# Patient Record
Sex: Male | Born: 1990 | Race: White | Hispanic: No | Marital: Single | Smoking: Current every day smoker
Health system: Southern US, Community
[De-identification: ages and names within clinical notes are randomized; demographics above are authoritative.]

## PROBLEM LIST (undated history)

## (undated) DIAGNOSIS — S12110A Anterior displaced Type II dens fracture, initial encounter for closed fracture: Secondary | ICD-10-CM

## (undated) HISTORY — PX: MOUTH SURGERY: SHX715

---
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
## 2010-01-21 ENCOUNTER — Emergency Department (HOSPITAL_COMMUNITY)
Admission: EM | Admit: 2010-01-21 | Discharge: 2010-01-21 | Payer: Self-pay | Source: Home / Self Care | Admitting: Emergency Medicine

## 2010-06-02 LAB — URINALYSIS, ROUTINE W REFLEX MICROSCOPIC
Glucose, UA: NEGATIVE mg/dL
Ketones, ur: NEGATIVE mg/dL
pH: 7.5 (ref 5.0–8.0)

## 2010-06-02 LAB — COMPREHENSIVE METABOLIC PANEL
ALT: 10 U/L (ref 0–53)
AST: 18 U/L (ref 0–37)
Albumin: 4.3 g/dL (ref 3.5–5.2)
Alkaline Phosphatase: 55 U/L (ref 52–171)
Potassium: 3.5 mEq/L (ref 3.5–5.1)
Sodium: 136 mEq/L (ref 135–145)
Total Protein: 6.6 g/dL (ref 6.0–8.3)

## 2010-06-02 LAB — DIFFERENTIAL
Eosinophils Absolute: 0.1 10*3/uL (ref 0.0–1.2)
Eosinophils Relative: 1 % (ref 0–5)
Lymphs Abs: 2 10*3/uL (ref 1.1–4.8)
Monocytes Absolute: 0.3 10*3/uL (ref 0.2–1.2)
Monocytes Relative: 6 % (ref 3–11)
Neutrophils Relative %: 57 % (ref 43–71)

## 2010-06-02 LAB — CBC
MCHC: 35.4 g/dL (ref 31.0–37.0)
Platelets: 190 10*3/uL (ref 150–400)
RDW: 12.8 % (ref 11.4–15.5)
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
## 2010-11-30 LAB — RAPID URINE DRUG SCREEN, HOSP PERFORMED
Amphetamines: NOT DETECTED
Benzodiazepines: NOT DETECTED

## 2011-10-17 DIAGNOSIS — S12110A Anterior displaced Type II dens fracture, initial encounter for closed fracture: Secondary | ICD-10-CM

## 2011-10-17 HISTORY — DX: Anterior displaced type ii dens fracture, initial encounter for closed fracture: S12.110A

## 2011-11-10 ENCOUNTER — Emergency Department (HOSPITAL_COMMUNITY)
Admission: EM | Admit: 2011-11-10 | Discharge: 2011-11-11 | Disposition: A | Discharge status: Home / Self Care | Payer: BC Managed Care – PPO | Attending: Emergency Medicine | Admitting: Emergency Medicine

## 2011-11-10 ENCOUNTER — Emergency Department (HOSPITAL_COMMUNITY): Payer: BC Managed Care – PPO

## 2011-11-10 ENCOUNTER — Encounter (HOSPITAL_COMMUNITY): Payer: Self-pay

## 2011-11-10 DIAGNOSIS — S12100A Unspecified displaced fracture of second cervical vertebra, initial encounter for closed fracture: Secondary | ICD-10-CM

## 2011-11-10 DIAGNOSIS — S91009A Unspecified open wound, unspecified ankle, initial encounter: Secondary | ICD-10-CM | POA: Insufficient documentation

## 2011-11-10 DIAGNOSIS — Y92410 Unspecified street and highway as the place of occurrence of the external cause: Secondary | ICD-10-CM | POA: Insufficient documentation

## 2011-11-10 DIAGNOSIS — Z23 Encounter for immunization: Secondary | ICD-10-CM | POA: Insufficient documentation

## 2011-11-10 DIAGNOSIS — R079 Chest pain, unspecified: Secondary | ICD-10-CM | POA: Insufficient documentation

## 2011-11-10 DIAGNOSIS — S81009A Unspecified open wound, unspecified knee, initial encounter: Secondary | ICD-10-CM | POA: Insufficient documentation

## 2011-11-10 DIAGNOSIS — M542 Cervicalgia: Secondary | ICD-10-CM | POA: Insufficient documentation

## 2011-11-10 LAB — CBC
HCT: 45.9 % (ref 39.0–52.0)
MCHC: 35.9 g/dL (ref 30.0–36.0)
MCV: 90.2 fL (ref 78.0–100.0)
RDW: 12.5 % (ref 11.5–15.5)

## 2011-11-10 LAB — COMPREHENSIVE METABOLIC PANEL
Albumin: 4.3 g/dL (ref 3.5–5.2)
BUN: 6 mg/dL (ref 6–23)
Creatinine, Ser: 0.8 mg/dL (ref 0.50–1.35)
Potassium: 3.4 mEq/L — ABNORMAL LOW (ref 3.5–5.1)
Total Protein: 6.9 g/dL (ref 6.0–8.3)

## 2011-11-10 LAB — ETHANOL: Alcohol, Ethyl (B): 204 mg/dL — ABNORMAL HIGH (ref 0–11)

## 2011-11-10 MED ORDER — SODIUM CHLORIDE 0.9 % IV BOLUS (SEPSIS)
1000.0000 mL | Freq: Once | INTRAVENOUS | Status: AC
  Administered 2011-11-10: 1000 mL via INTRAVENOUS

## 2011-11-10 MED ORDER — TETANUS-DIPHTHERIA TOXOIDS TD 5-2 LFU IM INJ
0.5000 mL | INJECTION | Freq: Once | INTRAMUSCULAR | Status: AC
  Administered 2011-11-10: 0.5 mL via INTRAMUSCULAR
  Filled 2011-11-10: qty 0.5
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
## 2011-11-11 ENCOUNTER — Emergency Department (HOSPITAL_COMMUNITY): Payer: BC Managed Care – PPO

## 2011-11-11 ENCOUNTER — Encounter (HOSPITAL_COMMUNITY): Payer: Self-pay | Admitting: Radiology

## 2011-11-11 MED ORDER — IOHEXOL 300 MG/ML  SOLN
100.0000 mL | Freq: Once | INTRAMUSCULAR | Status: AC | PRN
  Administered 2011-11-11: 100 mL via INTRAVENOUS

## 2011-11-11 MED ORDER — HYDROCODONE-ACETAMINOPHEN 5-500 MG PO TABS: 1.0000 | ORAL_TABLET | Freq: Four times a day (QID) | ORAL | Status: DC | PRN

## 2011-11-11 NOTE — ED Notes (Signed)
Pt. To CT

## 2011-11-11 NOTE — ED Notes (Signed)
Pt. Moving all extremities. Pedal and radial pulses strong bilaterally. Pt. Able to feel touch in all four extremities, states "It feels a little lighter in lower extremities". Pt. Very anxious.

## 2011-11-11 NOTE — ED Notes (Signed)
Pt. Told of c-spine precautions. Pt. Verbalized understanding of risks and benefits of using ccollar. Tried to help patient find a ride home, called mother multiple times with no answer. Pt began cursing at this nurse. [employer] to escort patient out. Pt. Refused wheelchair, bus pass, or cab options. Pt. Walked with stable gait to lobby with [employer] officer.

## 2011-11-16 HISTORY — PX: ODONTOID SCREW INSERTION: SHX5704

## 2011-11-26 ENCOUNTER — Other Ambulatory Visit: Payer: Self-pay | Admitting: Neurosurgery

## 2011-11-26 ENCOUNTER — Encounter (HOSPITAL_COMMUNITY): Payer: Self-pay | Admitting: Pharmacy Technician

## 2011-12-03 ENCOUNTER — Encounter (HOSPITAL_COMMUNITY)
Admission: RE | Admit: 2011-12-03 | Discharge: 2011-12-03 | Disposition: A | Discharge status: Home / Self Care | Payer: BC Managed Care – PPO | Source: Ambulatory Visit | Attending: Neurosurgery | Admitting: Neurosurgery

## 2011-12-03 ENCOUNTER — Encounter (HOSPITAL_COMMUNITY): Payer: Self-pay

## 2011-12-03 LAB — CBC
HCT: 46.1 % (ref 39.0–52.0)
Hemoglobin: 16 g/dL (ref 13.0–17.0)
MCHC: 34.7 g/dL (ref 30.0–36.0)
MCV: 91.5 fL (ref 78.0–100.0)

## 2011-12-03 LAB — SURGICAL PCR SCREEN: Staphylococcus aureus: NEGATIVE

## 2011-12-03 NOTE — Pre-Procedure Instructions (Signed)
Brady Adams  12/03/2011   Your procedure is scheduled on:  12-09-2011  Report to [facility] at 9:30 AM.  Call this number if you have problems the morning of surgery: [phone]   Remember:   Do not eat food:After Midnight.      Take these medicines the morning of surgery with A SIP OF WATER: pain medication as needed   Do not wear jewelry  Do not wear lotions, powders, or perfumes. You may wear deodorant.  Do not shave 48 hours prior to surgery. Men may shave face and neck.  Do not bring valuables to the hospital.  Contacts, dentures or bridgework may not be worn into surgery.  Leave suitcase in the car. After surgery it may be brought to your room.    For patients admitted to the hospital, checkout time is 11:00 AM the day of discharge.   Patients discharged the day of surgery will not be allowed to drive home.   Name and phone number of your driver: ______________    Special Instructions: Shower using CHG 2 nights before surgery and the night before surgery.  If you shower the day of surgery use CHG.  Use special wash - you have one bottle of CHG for all showers.  You should use approximately 1/3 of the bottle for each shower.     Please read over the following fact sheets that you were given: Pain Booklet and Surgical Site Infection Prevention

## 2011-12-08 MED ORDER — CEFAZOLIN SODIUM-DEXTROSE 2-3 GM-% IV SOLR
2.0000 g | INTRAVENOUS | Status: AC
  Administered 2011-12-09: 2 g via INTRAVENOUS

## 2011-12-09 ENCOUNTER — Encounter (HOSPITAL_COMMUNITY): Payer: Self-pay | Admitting: Certified Registered Nurse Anesthetist

## 2011-12-09 ENCOUNTER — Ambulatory Visit (HOSPITAL_COMMUNITY): Payer: BC Managed Care – PPO

## 2011-12-09 ENCOUNTER — Encounter (HOSPITAL_COMMUNITY)
Admission: RE | Disposition: A | Discharge status: Home / Self Care | Payer: Self-pay | Source: Ambulatory Visit | Attending: Neurosurgery

## 2011-12-09 ENCOUNTER — Encounter (HOSPITAL_COMMUNITY): Payer: Self-pay | Admitting: Certified Registered"

## 2011-12-09 ENCOUNTER — Ambulatory Visit (HOSPITAL_COMMUNITY)
Admission: RE | Admit: 2011-12-09 | Discharge: 2011-12-10 | Disposition: A | Discharge status: Home / Self Care | Payer: BC Managed Care – PPO | Source: Ambulatory Visit | Attending: Neurosurgery | Admitting: Neurosurgery

## 2011-12-09 ENCOUNTER — Ambulatory Visit (HOSPITAL_COMMUNITY): Payer: BC Managed Care – PPO | Admitting: Certified Registered"

## 2011-12-09 DIAGNOSIS — Z01812 Encounter for preprocedural laboratory examination: Secondary | ICD-10-CM | POA: Insufficient documentation

## 2011-12-09 DIAGNOSIS — S12100A Unspecified displaced fracture of second cervical vertebra, initial encounter for closed fracture: Secondary | ICD-10-CM | POA: Insufficient documentation

## 2011-12-09 SURGERY — INSERTION, SCREW, ODONTOID
Anesthesia: General | Site: Neck | Wound class: Clean

## 2011-12-09 MED ORDER — NEOSTIGMINE METHYLSULFATE 1 MG/ML IJ SOLN
INTRAMUSCULAR | Status: DC | PRN
  Administered 2011-12-09: 3 mg via INTRAVENOUS

## 2011-12-09 MED ORDER — BACITRACIN 50000 UNITS IM SOLR
INTRAMUSCULAR | Status: AC
  Filled 2011-12-09: qty 1

## 2011-12-09 MED ORDER — HYDROMORPHONE HCL PF 1 MG/ML IJ SOLN
INTRAMUSCULAR | Status: AC
  Filled 2011-12-09: qty 1

## 2011-12-09 MED ORDER — ONDANSETRON HCL 4 MG/2ML IJ SOLN: 4.0000 mg | Freq: Once | INTRAMUSCULAR | Status: DC | PRN

## 2011-12-09 MED ORDER — PROPOFOL 10 MG/ML IV BOLUS
INTRAVENOUS | Status: DC | PRN
  Administered 2011-12-09: 150 mg via INTRAVENOUS

## 2011-12-09 MED ORDER — LACTATED RINGERS IV SOLN: INTRAVENOUS | Status: DC

## 2011-12-09 MED ORDER — ONDANSETRON HCL 4 MG/2ML IJ SOLN
INTRAMUSCULAR | Status: DC | PRN
  Administered 2011-12-09: 4 mg via INTRAVENOUS

## 2011-12-09 MED ORDER — ROCURONIUM BROMIDE 100 MG/10ML IV SOLN
INTRAVENOUS | Status: DC | PRN
  Administered 2011-12-09: 20 mg via INTRAVENOUS

## 2011-12-09 MED ORDER — BACITRACIN ZINC 500 UNIT/GM EX OINT
TOPICAL_OINTMENT | CUTANEOUS | Status: DC | PRN
  Administered 2011-12-09: 1 via TOPICAL

## 2011-12-09 MED ORDER — ARTIFICIAL TEARS OP OINT
TOPICAL_OINTMENT | OPHTHALMIC | Status: DC | PRN
  Administered 2011-12-09: 1 via OPHTHALMIC

## 2011-12-09 MED ORDER — SUCCINYLCHOLINE CHLORIDE 20 MG/ML IJ SOLN
INTRAMUSCULAR | Status: DC | PRN
  Administered 2011-12-09: 100 mg via INTRAVENOUS

## 2011-12-09 MED ORDER — THROMBIN 5000 UNITS EX KIT
PACK | CUTANEOUS | Status: DC | PRN
  Administered 2011-12-09 (×2): 5000 [IU] via TOPICAL

## 2011-12-09 MED ORDER — LACTATED RINGERS IV SOLN
INTRAVENOUS | Status: DC | PRN
  Administered 2011-12-09 (×2): via INTRAVENOUS

## 2011-12-09 MED ORDER — ZOLPIDEM TARTRATE 5 MG PO TABS: 5.0000 mg | ORAL_TABLET | Freq: Every evening | ORAL | Status: DC | PRN

## 2011-12-09 MED ORDER — OXYCODONE-ACETAMINOPHEN 5-325 MG PO TABS
1.0000 | ORAL_TABLET | ORAL | Status: DC | PRN
  Administered 2011-12-10: 2 via ORAL
  Filled 2011-12-09: qty 2

## 2011-12-09 MED ORDER — PHENYLEPHRINE HCL 10 MG/ML IJ SOLN
INTRAMUSCULAR | Status: DC | PRN
  Administered 2011-12-09 (×2): 20 ug via INTRAVENOUS

## 2011-12-09 MED ORDER — ONDANSETRON HCL 4 MG/2ML IJ SOLN: 4.0000 mg | INTRAMUSCULAR | Status: DC | PRN

## 2011-12-09 MED ORDER — HEMOSTATIC AGENTS (NO CHARGE) OPTIME
TOPICAL | Status: DC | PRN
  Administered 2011-12-09: 1 via TOPICAL

## 2011-12-09 MED ORDER — MIDAZOLAM HCL 5 MG/5ML IJ SOLN
INTRAMUSCULAR | Status: DC | PRN
  Administered 2011-12-09: 2 mg via INTRAVENOUS

## 2011-12-09 MED ORDER — PHENOL 1.4 % MT LIQD: 1.0000 | OROMUCOSAL | Status: DC | PRN

## 2011-12-09 MED ORDER — DOCUSATE SODIUM 100 MG PO CAPS
100.0000 mg | ORAL_CAPSULE | Freq: Two times a day (BID) | ORAL | Status: DC
  Administered 2011-12-09 – 2011-12-10 (×2): 100 mg via ORAL
  Filled 2011-12-09 (×2): qty 1

## 2011-12-09 MED ORDER — GLYCOPYRROLATE 0.2 MG/ML IJ SOLN
INTRAMUSCULAR | Status: DC | PRN
  Administered 2011-12-09: .4 mg via INTRAVENOUS

## 2011-12-09 MED ORDER — FENTANYL CITRATE 0.05 MG/ML IJ SOLN
INTRAMUSCULAR | Status: DC | PRN
  Administered 2011-12-09: 100 ug via INTRAVENOUS
  Administered 2011-12-09 (×2): 50 ug via INTRAVENOUS

## 2011-12-09 MED ORDER — HYDROMORPHONE HCL PF 1 MG/ML IJ SOLN
0.2500 mg | INTRAMUSCULAR | Status: DC | PRN
  Administered 2011-12-09 (×4): 0.5 mg via INTRAVENOUS

## 2011-12-09 MED ORDER — DIAZEPAM 5 MG PO TABS
ORAL_TABLET | ORAL | Status: AC
  Filled 2011-12-09: qty 1

## 2011-12-09 MED ORDER — MORPHINE SULFATE 2 MG/ML IJ SOLN: 1.0000 mg | INTRAMUSCULAR | Status: DC | PRN

## 2011-12-09 MED ORDER — DEXAMETHASONE SODIUM PHOSPHATE 4 MG/ML IJ SOLN
4.0000 mg | Freq: Four times a day (QID) | INTRAMUSCULAR | Status: AC
  Administered 2011-12-09 – 2011-12-10 (×3): 4 mg via INTRAVENOUS
  Filled 2011-12-09 (×3): qty 1

## 2011-12-09 MED ORDER — CEFAZOLIN SODIUM-DEXTROSE 2-3 GM-% IV SOLR
2.0000 g | Freq: Three times a day (TID) | INTRAVENOUS | Status: AC
  Administered 2011-12-10 (×2): 2 g via INTRAVENOUS
  Filled 2011-12-09 (×2): qty 50

## 2011-12-09 MED ORDER — OXYCODONE-ACETAMINOPHEN 5-325 MG PO TABS
ORAL_TABLET | ORAL | Status: AC
  Filled 2011-12-09: qty 2

## 2011-12-09 MED ORDER — 0.9 % SODIUM CHLORIDE (POUR BTL) OPTIME
TOPICAL | Status: DC | PRN
  Administered 2011-12-09: 1000 mL

## 2011-12-09 MED ORDER — HYDROCODONE-ACETAMINOPHEN 5-325 MG PO TABS: 1.0000 | ORAL_TABLET | ORAL | Status: DC | PRN

## 2011-12-09 MED ORDER — SODIUM CHLORIDE 0.9 % IV SOLN
INTRAVENOUS | Status: AC
  Filled 2011-12-09: qty 500

## 2011-12-09 MED ORDER — ACETAMINOPHEN 325 MG PO TABS: 650.0000 mg | ORAL_TABLET | ORAL | Status: DC | PRN

## 2011-12-09 MED ORDER — ACETAMINOPHEN 650 MG RE SUPP: 650.0000 mg | RECTAL | Status: DC | PRN

## 2011-12-09 MED ORDER — MENTHOL 3 MG MT LOZG: 1.0000 | LOZENGE | OROMUCOSAL | Status: DC | PRN

## 2011-12-09 MED ORDER — BUPIVACAINE-EPINEPHRINE 0.5% -1:200000 IJ SOLN
INTRAMUSCULAR | Status: DC | PRN
  Administered 2011-12-09: 10 mL

## 2011-12-09 MED ORDER — LIDOCAINE HCL 4 % MT SOLN
OROMUCOSAL | Status: DC | PRN
  Administered 2011-12-09: 4 mL via TOPICAL

## 2011-12-09 MED ORDER — DEXAMETHASONE 4 MG PO TABS: 4.0000 mg | ORAL_TABLET | Freq: Four times a day (QID) | ORAL | Status: AC

## 2011-12-09 MED ORDER — LIDOCAINE HCL (CARDIAC) 20 MG/ML IV SOLN
INTRAVENOUS | Status: DC | PRN
  Administered 2011-12-09: 60 mg via INTRAVENOUS

## 2011-12-09 MED ORDER — SODIUM CHLORIDE 0.9 % IR SOLN
Status: DC | PRN
  Administered 2011-12-09: 16:00:00

## 2011-12-09 MED ORDER — DIAZEPAM 5 MG PO TABS
5.0000 mg | ORAL_TABLET | Freq: Four times a day (QID) | ORAL | Status: DC | PRN
  Administered 2011-12-09 – 2011-12-10 (×2): 5 mg via ORAL
  Filled 2011-12-09: qty 1

## 2011-12-09 SURGICAL SUPPLY — 53 items
APL SKNCLS STERI-STRIP NONHPOA (GAUZE/BANDAGES/DRESSINGS) ×1
BENZOIN TINCTURE PRP APPL 2/3 (GAUZE/BANDAGES/DRESSINGS) ×2 IMPLANT
BIT DRILL UCSC CANN STRL (BIT) IMPLANT
BLADE SURG ROTATE 9660 (MISCELLANEOUS) IMPLANT
CANISTER SUCTION 2500CC (MISCELLANEOUS) ×2 IMPLANT
CLOTH BEACON ORANGE TIMEOUT ST (SAFETY) ×2 IMPLANT
CONT SPEC 4OZ CLIKSEAL STRL BL (MISCELLANEOUS) ×2 IMPLANT
DRAPE C-ARM 42X72 X-RAY (DRAPES) ×3 IMPLANT
DRAPE LAPAROTOMY 100X72 PEDS (DRAPES) ×2 IMPLANT
DRAPE MICROSCOPE LEICA (MISCELLANEOUS) IMPLANT
DRAPE POUCH INSTRU U-SHP 10X18 (DRAPES) ×2 IMPLANT
DRAPE PROXIMA HALF (DRAPES) IMPLANT
DRILL BIT UCSC CANN STERILE (BIT) ×2
ELECT BLADE 4.0 EZ CLEAN MEGAD (MISCELLANEOUS) ×2
ELECT REM PT RETURN 9FT ADLT (ELECTROSURGICAL) ×2
ELECTRODE BLDE 4.0 EZ CLN MEGD (MISCELLANEOUS) IMPLANT
ELECTRODE REM PT RTRN 9FT ADLT (ELECTROSURGICAL) ×1 IMPLANT
GAUZE SPONGE 4X4 16PLY XRAY LF (GAUZE/BANDAGES/DRESSINGS) IMPLANT
GLOVE BIO SURGEON STRL SZ7 (GLOVE) ×1 IMPLANT
GLOVE BIO SURGEON STRL SZ8.5 (GLOVE) ×2 IMPLANT
GLOVE BIOGEL PI IND STRL 7.5 (GLOVE) IMPLANT
GLOVE BIOGEL PI IND STRL 8 (GLOVE) IMPLANT
GLOVE BIOGEL PI INDICATOR 7.5 (GLOVE) ×2
GLOVE BIOGEL PI INDICATOR 8 (GLOVE) ×1
GLOVE ECLIPSE 7.5 STRL STRAW (GLOVE) ×2 IMPLANT
GLOVE EXAM NITRILE LRG STRL (GLOVE) IMPLANT
GLOVE EXAM NITRILE MD LF STRL (GLOVE) ×1 IMPLANT
GLOVE EXAM NITRILE XL STR (GLOVE) IMPLANT
GLOVE EXAM NITRILE XS STR PU (GLOVE) IMPLANT
GLOVE INDICATOR 7.0 STRL GRN (GLOVE) ×1 IMPLANT
GLOVE SS BIOGEL STRL SZ 8 (GLOVE) ×1 IMPLANT
GLOVE SUPERSENSE BIOGEL SZ 8 (GLOVE) ×1
GOWN BRE IMP SLV AUR LG STRL (GOWN DISPOSABLE) ×1 IMPLANT
GOWN BRE IMP SLV AUR XL STRL (GOWN DISPOSABLE) ×4 IMPLANT
GUIDE WIRE ×1 IMPLANT
KIT BASIN OR (CUSTOM PROCEDURE TRAY) ×2 IMPLANT
KIT ROOM TURNOVER OR (KITS) ×2 IMPLANT
NDL HYPO 25X1 1.5 SAFETY (NEEDLE) ×1 IMPLANT
NEEDLE HYPO 25X1 1.5 SAFETY (NEEDLE) ×2 IMPLANT
NS IRRIG 1000ML POUR BTL (IV SOLUTION) ×2 IMPLANT
PACK LAMINECTOMY NEURO (CUSTOM PROCEDURE TRAY) ×2 IMPLANT
RUBBERBAND STERILE (MISCELLANEOUS) IMPLANT
SCREW LAG 40MM (Screw) ×1 IMPLANT
SPONGE GAUZE 4X4 12PLY (GAUZE/BANDAGES/DRESSINGS) ×2 IMPLANT
SPONGE INTESTINAL PEANUT (DISPOSABLE) ×2 IMPLANT
SPONGE SURGIFOAM ABS GEL SZ50 (HEMOSTASIS) ×2 IMPLANT
STRIP CLOSURE SKIN 1/2X4 (GAUZE/BANDAGES/DRESSINGS) ×2 IMPLANT
SUT VIC AB 3-0 SH 8-18 (SUTURE) ×3 IMPLANT
SYR 20ML ECCENTRIC (SYRINGE) ×2 IMPLANT
TAPE CLOTH SURG 4X10 WHT LF (GAUZE/BANDAGES/DRESSINGS) ×1 IMPLANT
TOWEL OR 17X24 6PK STRL BLUE (TOWEL DISPOSABLE) ×2 IMPLANT
TOWEL OR 17X26 10 PK STRL BLUE (TOWEL DISPOSABLE) ×2 IMPLANT
WATER STERILE IRR 1000ML POUR (IV SOLUTION) ×2 IMPLANT

## 2011-12-09 NOTE — Preoperative (Signed)
Beta Blockers   Reason not to administer Beta Blockers:Not Applicable 

## 2011-12-09 NOTE — Anesthesia Procedure Notes (Signed)
Procedure Name: Intubation Date/Time: 12/09/2011 3:21 PM Performed by: Margaree Mackintosh Pre-anesthesia Checklist: Patient identified, Timeout performed, Emergency Drugs available, Suction available and Patient being monitored Patient Re-evaluated:Patient Re-evaluated prior to inductionOxygen Delivery Method: Circle system utilized Preoxygenation: Pre-oxygenation with 100% oxygen Intubation Type: IV induction and Rapid sequence Laryngoscope size: Glidescope utilized. Tube size: 7.5 mm Number of attempts: 1 Airway Equipment and Method: Video-laryngoscopy,  Rigid stylet and LTA kit utilized Placement Confirmation: ETT inserted through vocal cords under direct vision,  positive ETCO2 and breath sounds checked- equal and bilateral Secured at: 22 cm Tube secured with: Tape Dental Injury: Teeth and Oropharynx as per pre-operative assessment  Comments: Head and neck held neutral by Dr.Smith during intubation
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
## 2011-12-09 NOTE — Transfer of Care (Signed)
Immediate Anesthesia Transfer of Care Note  Patient: Brady Adams  Procedure(s) Performed: Procedure(s) (LRB) with comments: ODONTOID SCREW INSERTION (N/A) - Odontoid screw fixation  Patient Location: PACU  Anesthesia Type: General  Level of Consciousness: awake, alert  and oriented  Airway & Oxygen Therapy: Patient Spontanous Breathing and Patient connected to nasal cannula oxygen  Post-op Assessment: Report given to PACU RN, Post -op Vital signs reviewed and stable and Patient moving all extremities X 4  Post vital signs: Reviewed and stable  Complications: No apparent anesthesia complications

## 2011-12-09 NOTE — Anesthesia Postprocedure Evaluation (Signed)
  Anesthesia Post-op Note  Patient: Brady Adams  Procedure(s) Performed: Procedure(s) (LRB) with comments: ODONTOID SCREW INSERTION (N/A) - Odontoid screw fixation  Patient Location: PACU  Anesthesia Type: General  Level of Consciousness: awake  Airway and Oxygen Therapy: Patient Spontanous Breathing  Post-op Pain: mild  Post-op Assessment: Post-op Vital signs reviewed  Post-op Vital Signs: Reviewed  Complications: No apparent anesthesia complications

## 2011-12-09 NOTE — Anesthesia Preprocedure Evaluation (Signed)
Anesthesia Evaluation  Patient identified by MRN, date of birth, ID band Patient awake    Reviewed: Allergy & Precautions, H&P , NPO status , Patient's Chart, lab work & pertinent test results  Airway       Dental   Pulmonary          Cardiovascular     Neuro/Psych    GI/Hepatic   Endo/Other    Renal/GU      Musculoskeletal   Abdominal   Peds  Hematology   Anesthesia Other Findings   Reproductive/Obstetrics                           Anesthesia Physical Anesthesia Plan  ASA: I  Anesthesia Plan: General   Post-op Pain Management:    Induction: Intravenous  Airway Management Planned: LMA and Oral ETT  Additional Equipment:   Intra-op Plan:   Post-operative Plan: Extubation in OR  Informed Consent: I have reviewed the patients History and Physical, chart, labs and discussed the procedure including the risks, benefits and alternatives for the proposed anesthesia with the patient or authorized representative who has indicated his/her understanding and acceptance.     Plan Discussed with: CRNA, Anesthesiologist and Surgeon  Anesthesia Plan Comments:         Anesthesia Quick Evaluation  
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
## 2011-12-09 NOTE — Progress Notes (Signed)
Subjective:  The patient is somnolent but easily arousable. He looks well.  Objective: Vital signs in last 24 hours: Temp:  [97.4 F (36.3 C)-97.7 F (36.5 C)] 97.4 F (36.3 C) (10/24 1728) Pulse Rate:  [64-86] 64  (10/24 1728) Resp:  [10-20] 10  (10/24 1728) BP: (114-141)/(72-84) 141/84 mmHg (10/24 1728) SpO2:  [99 %-100 %] 100 % (10/24 1728)  Intake/Output from previous day:   Intake/Output this shift: Total I/O In: 1400 [I.V.:1400] Out: 15 [Blood:15]  Physical exam the patient is somnolent but easily arousable. He is moving all 4 extremities well. His dressing is clean and dry. There is no evidence of hematoma or shift.  Lab Results: No results found for this basename: WBC:2,HGB:2,HCT:2,PLT:2 in the last 72 hours BMET No results found for this basename: NA:2,K:2,CL:2,CO2:2,GLUCOSE:2,BUN:2,CREATININE:2,CALCIUM:2 in the last 72 hours  Studies/Results: No results found.  Assessment/Plan: The patient is doing well.  LOS: 0 days     Arleene Settle D 12/09/2011, 5:38 PM

## 2011-12-10 MED ORDER — DSS 100 MG PO CAPS: 100.0000 mg | ORAL_CAPSULE | Freq: Two times a day (BID) | ORAL | Status: DC

## 2011-12-10 MED ORDER — OXYCODONE-ACETAMINOPHEN 10-325 MG PO TABS: 1.0000 | ORAL_TABLET | ORAL | Status: DC | PRN

## 2011-12-10 MED ORDER — DIAZEPAM 5 MG PO TABS: 5.0000 mg | ORAL_TABLET | Freq: Four times a day (QID) | ORAL | Status: DC | PRN

## 2011-12-10 NOTE — Plan of Care (Signed)
Problem: Consults Goal: Diagnosis - Spinal Surgery Outcome: Completed/Met Date Met:  12/10/11 Cervical Spine Fusion     

## 2011-12-10 NOTE — Discharge Summary (Signed)
  Physician Discharge Summary  Patient ID: Brady Adams MRN: [id number] DOB/AGE: 21-27-1992 21 y.o.  Admit date: 12/09/2011 Discharge date: 12/10/2011  Admission Diagnoses: Type II odontoid fracture, cervicalgia  Discharge Diagnoses: The same Active Problems:  * No active hospital problems. *    Discharged Condition: good  Hospital Course: I admitted the patient to [facility] on 10/24 13. On that day I performed at anterior odontoid screw fixation of a type II odontoid fracture. The surgery went well. The patient's postoperative course was unremarkable. On postop day #1 the patient was requesting discharge to home. He was given oral and written discharge instructions. All his questions were answered.  Consults: None Significant Diagnostic Studies: None Treatments: Anterior odontoid screw fixation of type II odontoid fracture Discharge Exam: Blood pressure 119/62, pulse 99, temperature 98.6 F (37 C), temperature source Oral, resp. rate 18, SpO2 97.00%. The patient is alert and oriented. He looks well. His strength is normal in all 4 extremities. His dressing is clean and dry. There is no evidence of hematoma or shift.  Disposition: Home     Medication List     As of 12/10/2011  9:34 AM    STOP taking these medications         HYDROcodone-acetaminophen 5-500 MG per tablet   Commonly known as: VICODIN      naproxen sodium 220 MG tablet   Commonly known as: ANAPROX      TAKE these medications         diazepam 5 MG tablet   Commonly known as: VALIUM   Take 1 tablet (5 mg total) by mouth every 6 (six) hours as needed.      DSS 100 MG Caps   Take 100 mg by mouth 2 (two) times daily.      oxyCODONE-acetaminophen 10-325 MG per tablet   Commonly known as: PERCOCET   Take 1 tablet by mouth every 4 (four) hours as needed for pain.         SignedCristi Loron 12/10/2011, 9:34 AM

## 2012-03-02 ENCOUNTER — Emergency Department (HOSPITAL_COMMUNITY): Payer: BC Managed Care – PPO

## 2012-03-02 ENCOUNTER — Inpatient Hospital Stay (HOSPITAL_COMMUNITY): Payer: BC Managed Care – PPO

## 2012-03-02 ENCOUNTER — Encounter (HOSPITAL_COMMUNITY): Payer: Self-pay | Admitting: *Deleted

## 2012-03-02 ENCOUNTER — Inpatient Hospital Stay (HOSPITAL_COMMUNITY)
Admission: EM | Admit: 2012-03-02 | Discharge: 2012-03-09 | DRG: 793 | Disposition: A | Discharge status: Home / Self Care | Payer: BC Managed Care – PPO | Attending: General Surgery | Admitting: General Surgery

## 2012-03-02 DIAGNOSIS — S12300A Unspecified displaced fracture of fourth cervical vertebra, initial encounter for closed fracture: Secondary | ICD-10-CM | POA: Diagnosis present

## 2012-03-02 DIAGNOSIS — IMO0002 Reserved for concepts with insufficient information to code with codable children: Principal | ICD-10-CM | POA: Diagnosis present

## 2012-03-02 DIAGNOSIS — S06339A Contusion and laceration of cerebrum, unspecified, with loss of consciousness of unspecified duration, initial encounter: Secondary | ICD-10-CM | POA: Diagnosis present

## 2012-03-02 DIAGNOSIS — F17200 Nicotine dependence, unspecified, uncomplicated: Secondary | ICD-10-CM | POA: Diagnosis present

## 2012-03-02 DIAGNOSIS — S0990XA Unspecified injury of head, initial encounter: Secondary | ICD-10-CM

## 2012-03-02 DIAGNOSIS — S069XAA Unspecified intracranial injury with loss of consciousness status unknown, initial encounter: Secondary | ICD-10-CM | POA: Diagnosis present

## 2012-03-02 DIAGNOSIS — S129XXA Fracture of neck, unspecified, initial encounter: Secondary | ICD-10-CM

## 2012-03-02 DIAGNOSIS — S069X9A Unspecified intracranial injury with loss of consciousness of unspecified duration, initial encounter: Secondary | ICD-10-CM

## 2012-03-02 DIAGNOSIS — J9600 Acute respiratory failure, unspecified whether with hypoxia or hypercapnia: Secondary | ICD-10-CM | POA: Diagnosis present

## 2012-03-02 DIAGNOSIS — S0101XA Laceration without foreign body of scalp, initial encounter: Secondary | ICD-10-CM | POA: Diagnosis present

## 2012-03-02 DIAGNOSIS — S0100XA Unspecified open wound of scalp, initial encounter: Secondary | ICD-10-CM | POA: Diagnosis present

## 2012-03-02 DIAGNOSIS — F1010 Alcohol abuse, uncomplicated: Secondary | ICD-10-CM | POA: Diagnosis present

## 2012-03-02 HISTORY — DX: Anterior displaced type ii dens fracture, initial encounter for closed fracture: S12.110A

## 2012-03-02 LAB — POCT I-STAT, CHEM 8
Calcium, Ion: 1.09 mmol/L — ABNORMAL LOW (ref 1.12–1.23)
Chloride: 109 mEq/L (ref 96–112)
Glucose, Bld: 101 mg/dL — ABNORMAL HIGH (ref 70–99)
HCT: 45 % (ref 39.0–52.0)
Hemoglobin: 15.3 g/dL (ref 13.0–17.0)
Potassium: 5.1 mEq/L (ref 3.5–5.1)

## 2012-03-02 LAB — COMPREHENSIVE METABOLIC PANEL
ALT: 24 U/L (ref 0–53)
AST: 41 U/L — ABNORMAL HIGH (ref 0–37)
Albumin: 4.3 g/dL (ref 3.5–5.2)
Alkaline Phosphatase: 57 U/L (ref 39–117)
CO2: 23 mEq/L (ref 19–32)
Chloride: 108 mEq/L (ref 96–112)
GFR calc non Af Amer: >90 mL/min (ref >90)
Potassium: 5.2 mEq/L — ABNORMAL HIGH (ref 3.5–5.1)
Sodium: 144 mEq/L (ref 135–145)
Total Bilirubin: 0.2 mg/dL — ABNORMAL LOW (ref 0.3–1.2)

## 2012-03-02 LAB — URINALYSIS, MICROSCOPIC ONLY
Bilirubin Urine: NEGATIVE
Ketones, ur: NEGATIVE mg/dL
Leukocytes, UA: NEGATIVE
Nitrite: NEGATIVE
Protein, ur: NEGATIVE mg/dL

## 2012-03-02 LAB — POCT I-STAT 3, ART BLOOD GAS (G3+)
Bicarbonate: 20.7 mEq/L (ref 20.0–24.0)
TCO2: 24 mmol/L (ref 0–100)
pCO2 arterial: 44.2 mmHg (ref 35.0–45.0)
pCO2 arterial: 35.2 mmHg (ref 35.0–45.0)
pH, Arterial: 7.325 — ABNORMAL LOW (ref 7.350–7.450)
pO2, Arterial: 531 mmHg — ABNORMAL HIGH (ref 80.0–100.0)
pO2, Arterial: 172 mmHg — ABNORMAL HIGH (ref 80.0–100.0)

## 2012-03-02 LAB — TYPE AND SCREEN: Unit division: 0

## 2012-03-02 LAB — MRSA PCR SCREENING: MRSA by PCR: NEGATIVE

## 2012-03-02 LAB — CBC
MCH: 31.4 pg (ref 26.0–34.0)
MCHC: 34.9 g/dL (ref 30.0–36.0)
MCV: 90.1 fL (ref 78.0–100.0)
Platelets: 180 10*3/uL (ref 150–400)

## 2012-03-02 LAB — PROTIME-INR: INR: 1.07 (ref 0.00–1.49)

## 2012-03-02 LAB — ABO/RH: ABO/RH(D): O POS

## 2012-03-02 LAB — ETHANOL: Alcohol, Ethyl (B): 237 mg/dL — ABNORMAL HIGH (ref 0–11)

## 2012-03-02 LAB — GLUCOSE, CAPILLARY: Glucose-Capillary: 98 mg/dL (ref 70–99)

## 2012-03-02 LAB — CG4 I-STAT (LACTIC ACID): Lactic Acid, Venous: 2.3 mmol/L — ABNORMAL HIGH (ref 0.5–2.2)

## 2012-03-02 MED ORDER — VECURONIUM BROMIDE 10 MG IV SOLR
INTRAVENOUS | Status: AC | PRN
  Administered 2012-03-02: 10 mg via INTRAVENOUS

## 2012-03-02 MED ORDER — MIDAZOLAM HCL 2 MG/2ML IJ SOLN
INTRAMUSCULAR | Status: AC
  Filled 2012-03-02: qty 2

## 2012-03-02 MED ORDER — FENTANYL BOLUS VIA INFUSION
25.0000 ug | Freq: Four times a day (QID) | INTRAVENOUS | Status: DC | PRN
  Filled 2012-03-02: qty 100

## 2012-03-02 MED ORDER — IOHEXOL 300 MG/ML  SOLN
80.0000 mL | Freq: Once | INTRAMUSCULAR | Status: AC | PRN
  Administered 2012-03-02: 80 mL via INTRAVENOUS

## 2012-03-02 MED ORDER — ROCURONIUM BROMIDE 50 MG/5ML IV SOLN
INTRAVENOUS | Status: AC
  Filled 2012-03-02: qty 2

## 2012-03-02 MED ORDER — BIOTENE DRY MOUTH MT LIQD
15.0000 mL | Freq: Four times a day (QID) | OROMUCOSAL | Status: DC
  Administered 2012-03-02 – 2012-03-03 (×4): 15 mL via OROMUCOSAL

## 2012-03-02 MED ORDER — FENTANYL CITRATE 0.05 MG/ML IJ SOLN
INTRAMUSCULAR | Status: AC
  Filled 2012-03-02: qty 2

## 2012-03-02 MED ORDER — MIDAZOLAM HCL 2 MG/2ML IJ SOLN
2.0000 mg | Freq: Once | INTRAMUSCULAR | Status: AC
  Administered 2012-03-02: 2 mg via INTRAVENOUS

## 2012-03-02 MED ORDER — ONDANSETRON HCL 4 MG PO TABS: 4.0000 mg | ORAL_TABLET | Freq: Four times a day (QID) | ORAL | Status: DC | PRN

## 2012-03-02 MED ORDER — PANTOPRAZOLE SODIUM 40 MG IV SOLR
40.0000 mg | Freq: Every day | INTRAVENOUS | Status: DC
  Administered 2012-03-03 – 2012-03-04 (×2): 40 mg via INTRAVENOUS
  Filled 2012-03-02 (×5): qty 40

## 2012-03-02 MED ORDER — FENTANYL CITRATE 0.05 MG/ML IJ SOLN
100.0000 ug | Freq: Once | INTRAMUSCULAR | Status: AC
  Administered 2012-03-02: 100 ug via INTRAVENOUS

## 2012-03-02 MED ORDER — NALOXONE HCL 0.4 MG/ML IJ SOLN
INTRAMUSCULAR | Status: AC
  Administered 2012-03-02: 0.4 mg via INTRAVENOUS
  Filled 2012-03-02: qty 1

## 2012-03-02 MED ORDER — CHLORHEXIDINE GLUCONATE 0.12 % MT SOLN
15.0000 mL | Freq: Two times a day (BID) | OROMUCOSAL | Status: DC
  Administered 2012-03-02 – 2012-03-03 (×3): 15 mL via OROMUCOSAL
  Filled 2012-03-02 (×3): qty 15

## 2012-03-02 MED ORDER — PROPOFOL 10 MG/ML IV EMUL
INTRAVENOUS | Status: AC
  Administered 2012-03-02: 25 ug/kg/min via INTRAVENOUS
  Filled 2012-03-02: qty 100

## 2012-03-02 MED ORDER — PROPOFOL 10 MG/ML IV EMUL
5.0000 ug/kg/min | INTRAVENOUS | Status: DC
  Administered 2012-03-02: 25 ug/kg/min via INTRAVENOUS
  Administered 2012-03-02 – 2012-03-03 (×2): 35 ug/kg/min via INTRAVENOUS
  Administered 2012-03-03: 30 ug/kg/min via INTRAVENOUS
  Filled 2012-03-02 (×3): qty 100

## 2012-03-02 MED ORDER — PANTOPRAZOLE SODIUM 40 MG PO TBEC
40.0000 mg | DELAYED_RELEASE_TABLET | Freq: Every day | ORAL | Status: DC
  Administered 2012-03-05 – 2012-03-06 (×2): 40 mg via ORAL
  Filled 2012-03-02 (×2): qty 1

## 2012-03-02 MED ORDER — ONDANSETRON HCL 4 MG/2ML IJ SOLN: 4.0000 mg | Freq: Four times a day (QID) | INTRAMUSCULAR | Status: DC | PRN

## 2012-03-02 MED ORDER — PROPOFOL 10 MG/ML IV EMUL
5.0000 ug/kg/min | Freq: Once | INTRAVENOUS | Status: AC
  Administered 2012-03-02: 5 ug/kg/min via INTRAVENOUS
  Filled 2012-03-02: qty 100

## 2012-03-02 MED ORDER — LIDOCAINE HCL (CARDIAC) 20 MG/ML IV SOLN
INTRAVENOUS | Status: AC
  Administered 2012-03-02: 100 mg via INTRAVENOUS
  Filled 2012-03-02: qty 5

## 2012-03-02 MED ORDER — SUCCINYLCHOLINE CHLORIDE 20 MG/ML IJ SOLN
INTRAMUSCULAR | Status: AC
  Administered 2012-03-02: 100 mg via INTRAVENOUS
  Filled 2012-03-02: qty 1

## 2012-03-02 MED ORDER — SODIUM CHLORIDE 0.9 % IV SOLN
25.0000 ug/h | INTRAVENOUS | Status: DC
  Administered 2012-03-02: 50 ug/h via INTRAVENOUS
  Filled 2012-03-02: qty 50

## 2012-03-02 MED ORDER — ETOMIDATE 2 MG/ML IV SOLN
INTRAVENOUS | Status: AC
  Administered 2012-03-02: 20 mg via INTRAVENOUS
  Filled 2012-03-02: qty 20

## 2012-03-02 MED ORDER — SUCCINYLCHOLINE CHLORIDE 20 MG/ML IJ SOLN
100.0000 mg | Freq: Once | INTRAMUSCULAR | Status: AC
  Administered 2012-03-02: 100 mg via INTRAVENOUS

## 2012-03-02 MED ORDER — POTASSIUM CHLORIDE IN NACL 20-0.9 MEQ/L-% IV SOLN
INTRAVENOUS | Status: DC
  Administered 2012-03-02 – 2012-03-04 (×4): via INTRAVENOUS
  Administered 2012-03-04 – 2012-03-05 (×3): 1000 mL via INTRAVENOUS
  Administered 2012-03-07: via INTRAVENOUS
  Filled 2012-03-02 (×14): qty 1000

## 2012-03-02 MED ORDER — ETOMIDATE 2 MG/ML IV SOLN
20.0000 mg | Freq: Once | INTRAVENOUS | Status: AC
  Administered 2012-03-02: 20 mg via INTRAVENOUS

## 2012-03-02 NOTE — Procedures (Signed)
Nitish Roes, MD, MPH, FACS Pager: [phone]  

## 2012-03-02 NOTE — ED Notes (Signed)
Pt. Intubated

## 2012-03-02 NOTE — ED Notes (Signed)
Pt mother at bedside
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
## 2012-03-02 NOTE — Procedures (Signed)
Procedure: Simple repair scalp laceration 3cm  Indication: Scalp laceration  Surgeon: Charma Igo, PA-C  Assist: None  Anesthesia: Propofol + fentanyl  EBL: Minimal  Complications: None  Findings: Patient was sedated on ventilator and could not give consent. Propofol was increased and fentanyl were given for sedation/analgesia. Wound was irrigated with 214mlNS/5ml betadine then closed with skin stapler. Patient tolerated procedure well.   Freeman Caldron, PA-C Pager: [phone] General Trauma PA Pager: [phone]

## 2012-03-02 NOTE — ED Notes (Signed)
Pt's contacts removed and given to mother.  
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
## 2012-03-02 NOTE — H&P (Signed)
Patient examined and I agree with the assessment and plan I also spoke to Dr. Franky Macho and the patient's mother at the bedside. Violeta Gelinas, MD, MPH, FACS Pager: [phone]  03/02/2012 1:38 PM
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
## 2012-03-02 NOTE — ED Notes (Signed)
Report called to [phone] RN to call back

## 2012-03-02 NOTE — ED Notes (Signed)
Family at bedside.  Mother Teri's number is [phone]

## 2012-03-03 ENCOUNTER — Inpatient Hospital Stay (HOSPITAL_COMMUNITY): Payer: BC Managed Care – PPO

## 2012-03-03 ENCOUNTER — Other Ambulatory Visit: Payer: Self-pay | Admitting: Neurosurgery

## 2012-03-03 DIAGNOSIS — J95821 Acute postprocedural respiratory failure: Secondary | ICD-10-CM

## 2012-03-03 LAB — CBC
Hemoglobin: 13.1 g/dL (ref 13.0–17.0)
MCH: 31 pg (ref 26.0–34.0)
MCHC: 33.7 g/dL (ref 30.0–36.0)
Platelets: 129 10*3/uL — ABNORMAL LOW (ref 150–400)
RDW: 12.8 % (ref 11.5–15.5)

## 2012-03-03 LAB — COMPREHENSIVE METABOLIC PANEL
ALT: 15 U/L (ref 0–53)
AST: 20 U/L (ref 0–37)
Albumin: 3.3 g/dL — ABNORMAL LOW (ref 3.5–5.2)
Alkaline Phosphatase: 51 U/L (ref 39–117)
Calcium: 8.5 mg/dL (ref 8.4–10.5)
Potassium: 3.9 mEq/L (ref 3.5–5.1)
Sodium: 143 mEq/L (ref 135–145)
Total Protein: 5.3 g/dL — ABNORMAL LOW (ref 6.0–8.3)

## 2012-03-03 MED ORDER — ALBUMIN HUMAN 5 % IV SOLN
INTRAVENOUS | Status: AC
  Administered 2012-03-03: 12.5 g via INTRAVENOUS
  Filled 2012-03-03: qty 250

## 2012-03-03 MED ORDER — ALBUMIN HUMAN 5 % IV SOLN
12.5000 g | Freq: Once | INTRAVENOUS | Status: AC
  Administered 2012-03-03: 12.5 g via INTRAVENOUS
  Filled 2012-03-03: qty 250

## 2012-03-03 MED ORDER — ALBUMIN HUMAN 5 % IV SOLN
12.5000 g | Freq: Once | INTRAVENOUS | Status: AC
  Administered 2012-03-03: 12.5 g via INTRAVENOUS

## 2012-03-03 MED ORDER — FENTANYL CITRATE 0.05 MG/ML IJ SOLN
25.0000 ug | INTRAMUSCULAR | Status: DC | PRN
  Administered 2012-03-03 – 2012-03-06 (×17): 50 ug via INTRAVENOUS
  Filled 2012-03-03 (×17): qty 2

## 2012-03-03 MED ORDER — HALOPERIDOL LACTATE 5 MG/ML IJ SOLN
5.0000 mg | Freq: Four times a day (QID) | INTRAMUSCULAR | Status: DC | PRN
  Administered 2012-03-07: 5 mg via INTRAVENOUS
  Filled 2012-03-03: qty 1

## 2012-03-03 MED ORDER — SODIUM CHLORIDE 0.9 % IV BOLUS (SEPSIS)
1000.0000 mL | Freq: Once | INTRAVENOUS | Status: AC
  Administered 2012-03-03: 1000 mL via INTRAVENOUS
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
## 2012-03-03 NOTE — Progress Notes (Signed)
Pt. Urine output is decreased. MD made aware. Albumin ordered.

## 2012-03-03 NOTE — Progress Notes (Signed)
UR completed 
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
## 2012-03-03 NOTE — Progress Notes (Signed)
PT Cancellation Note  Patient Details Name: Brady Adams MRN: [id number] DOB: February 06, 1991   Cancelled Treatment:    Reason Eval/Treat Not Completed: Patient not medically ready; awaiting C4 surgery. Not clear if pt OK to mobilize. Spoke with RN who will also attempt to get clarification.   If no activity desired, could proceed with TBI Team Consult at bedrest level if pt is a Rancho IV (confused, agitated) or lower on Rancho Cognitive Scale. (per notes, appears to be higher level)   Adriyanna Christians 03/03/2012, 3:39 PM Pager [phone]

## 2012-03-04 ENCOUNTER — Inpatient Hospital Stay (HOSPITAL_COMMUNITY): Payer: BC Managed Care – PPO

## 2012-03-04 LAB — BASIC METABOLIC PANEL
BUN: 7 mg/dL (ref 6–23)
Chloride: 104 mEq/L (ref 96–112)
GFR calc Af Amer: >90 mL/min (ref >90)
Potassium: 3.9 mEq/L (ref 3.5–5.1)
Sodium: 138 mEq/L (ref 135–145)

## 2012-03-04 LAB — CBC
HCT: 36.4 % — ABNORMAL LOW (ref 39.0–52.0)
RDW: 12.1 % (ref 11.5–15.5)
WBC: 6.5 10*3/uL (ref 4.0–10.5)

## 2012-03-04 MED ORDER — SODIUM CHLORIDE 0.9 % IJ SOLN: 10.0000 mL | INTRAMUSCULAR | Status: DC | PRN

## 2012-03-04 MED ORDER — SODIUM CHLORIDE 0.9 % IJ SOLN
10.0000 mL | Freq: Two times a day (BID) | INTRAMUSCULAR | Status: DC
  Administered 2012-03-04 – 2012-03-07 (×3): 10 mL
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
## 2012-03-04 NOTE — Progress Notes (Signed)
Peripherally Inserted Central Catheter/Midline Placement  The IV Nurse has discussed with the patient and/or persons authorized to consent for the patient, the purpose of this procedure and the potential benefits and risks involved with this procedure.  The benefits include less needle sticks, lab draws from the catheter and patient may be discharged home with the catheter.  Risks include, but not limited to, infection, bleeding, blood clot (thrombus formation), and puncture of an artery; nerve damage and irregular heat beat.  Alternatives to this procedure were also discussed.  PICC/Midline Placement Documentation  PICC / Midline Double Lumen 03/04/12 PICC Right Basilic (Active)       Dyshon Philbin, Lajean Manes 03/04/2012, 9:06 AM

## 2012-03-04 NOTE — Progress Notes (Signed)
Rehab Admissions Coordinator Note:  Patient was screened by Clois Dupes for appropriateness for an Inpatient Acute Rehab Consult.  At this time, we are recommending Inpatient Rehab consult after OR 03/08/12, as we see how he progresses with therapy postoperatively.  Clois Dupes 03/04/2012, 5:23 PM  I can be reached at [phone].
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
## 2012-03-04 NOTE — Progress Notes (Signed)
OT NOTE  Spoke directly with Dr. Yetta Barre this AM and patient is clear to be up with TBI team for evaluation with cervical collar on. Pt must wear cervical collar at all times.   Harrel Carina [place]   OTR/L Pager: [phone] Office: [phone] .
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
## 2012-03-05 MED ORDER — OXYCODONE-ACETAMINOPHEN 5-325 MG PO TABS
1.0000 | ORAL_TABLET | ORAL | Status: DC | PRN
  Administered 2012-03-05 – 2012-03-07 (×8): 2 via ORAL
  Filled 2012-03-05 (×8): qty 2

## 2012-03-05 NOTE — Progress Notes (Signed)
Patient ID: Brady Adams, male   DOB: 1990/03/09, 22 y.o.   MRN: [id number] Patient continues to complain of some pain and numbness in the hands and neck pain. He remains in a cervical collar. He had difficulty mobilizing yesterday secondary to pain. He has some weakness in his hands but he appears to be antigravity throughout. Dr. Lovell Sheehan planned surgery middle of the week.

## 2012-03-05 NOTE — Progress Notes (Addendum)
SLP Cancellation Note  Patient Details Name: Brady Adams MRN: [id number] DOB: [birth year]   Cancelled treatment: ST received order for Cognitive Linguistic Evaluation.  Evaluation deferred to 03/06/12.  Moreen Fowler MS, CCC-SLP [phone] [facility] 03/05/2012, 10:53 AM
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
## 2012-03-06 ENCOUNTER — Inpatient Hospital Stay (HOSPITAL_COMMUNITY): Payer: BC Managed Care – PPO

## 2012-03-06 LAB — BASIC METABOLIC PANEL
CO2: 25 mEq/L (ref 19–32)
Chloride: 105 mEq/L (ref 96–112)
Creatinine, Ser: 0.78 mg/dL (ref 0.50–1.35)
GFR calc Af Amer: >90 mL/min (ref >90)
Potassium: 3.6 mEq/L (ref 3.5–5.1)
Sodium: 137 mEq/L (ref 135–145)

## 2012-03-06 MED ORDER — MORPHINE SULFATE 2 MG/ML IJ SOLN
2.0000 mg | INTRAMUSCULAR | Status: DC | PRN
  Administered 2012-03-06 – 2012-03-07 (×3): 2 mg via INTRAVENOUS
  Filled 2012-03-06 (×3): qty 1

## 2012-03-06 MED ORDER — PREGABALIN 50 MG PO CAPS
75.0000 mg | ORAL_CAPSULE | Freq: Three times a day (TID) | ORAL | Status: DC
  Administered 2012-03-06 – 2012-03-08 (×7): 75 mg via ORAL
  Filled 2012-03-06 (×8): qty 1
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
## 2012-03-06 NOTE — Progress Notes (Signed)
Physical Therapy Treatment Patient Details Name: Brady Adams MRN: [id number] DOB: Jan 20, 1991 Today's Date: 03/06/2012 Time: 4098-1191 PT Time Calculation (min): 17 min  PT Assessment / Plan / Recommendation Comments on Treatment Session  Pt able to increase ambulation distance and overall mobility.  Pt anxious about surgery and stated "I'm just ready to get out of here and go home."  OT asked MD for left UE sling for extra support due to pain especially with ambulation.  Will continue to follow and recommend inpatient rehab at d/c.  Will continue to assess overall d/c plans after surgery (03/08/12).    Follow Up Recommendations  CIR     Equipment Recommendations  None recommended by PT    Recommendations for Other Services Rehab consult  Frequency Min 3X/week   Plan Discharge plan remains appropriate;Frequency remains appropriate    Precautions / Restrictions Precautions Precautions: Fall;Cervical Precaution Comments: picc line Rt UE, foley, ccollar, fall Required Braces or Orthoses: Cervical Brace Cervical Brace: Hard collar;Applied in supine position Restrictions Weight Bearing Restrictions: No   Pertinent Vitals/Pain C/o left UE pain but does not rate; c/o neck pain with ambulation    Mobility  Bed Mobility Bed Mobility: Supine to Sit;Sitting - Scoot to [place] of Bed;Sit to Supine Supine to Sit: 4: Min guard Sitting - Scoot to [place] of Bed: 4: Min guard Details for Bed Mobility Assistance: [place]inguard for safety with lines and pt slightly impulsive with movement Transfers Transfers: Sit to Stand;Stand to Sit Sit to Stand: 4: Min guard;From bed Stand to Sit: 4: Min guard;To chair/3-in-1 Details for Transfer Assistance: Minguard for safety with cues for hand placement.  Pt slightly impuslive with movements.   Ambulation/Gait Ambulation/Gait Assistance: 4: Min assist Ambulation Distance (Feet): 175 Feet Assistive device: 1 person hand held assist Ambulation/Gait Assistance  Details: (A) to support left LE with ambulation due to pain.  Occasional staggered gait and LOB.  Pt with increase cervical pain at end of ambulation and pt ready to return to room due to pain. Gait Pattern: Step-to pattern;Decreased stride length;Shuffle;Scissoring Stairs: No Wheelchair Mobility Wheelchair Mobility: No    Exercises     PT Diagnosis:    PT Problem List:   PT Treatment Interventions:     PT Goals Acute Rehab PT Goals PT Goal Formulation: With patient Time For Goal Achievement: 03/18/12 Potential to Achieve Goals: Good Pt will go Supine/Side to Sit: with modified independence PT Goal: Supine/Side to Sit - Progress: Progressing toward goal Pt will go Sit to Supine/Side: with modified independence PT Goal: Sit to Supine/Side - Progress: Progressing toward goal Pt will go Sit to Stand: with supervision PT Goal: Sit to Stand - Progress: Progressing toward goal Pt will go Stand to Sit: with supervision PT Goal: Stand to Sit - Progress: Progressing toward goal Pt will Transfer Bed to Chair/Chair to Bed: with supervision PT Transfer Goal: Bed to Chair/Chair to Bed - Progress: Progressing toward goal Pt will Stand: with supervision;3 - 5 min PT Goal: Stand - Progress: Progressing toward goal Pt will Ambulate: 51 - 150 feet;with supervision;with least restrictive assistive device PT Goal: Ambulate - Progress: Progressing toward goal  Visit Information  Last PT Received On: 03/06/12    Subjective Data  Subjective: "My left arm still hurts." Patient Stated Goal: Wants to go home ASAP   Cognition  Overall Cognitive Status: Appears within functional limits for tasks assessed/performed Area of Impairment: Rancho level Arousal/Alertness: Awake/alert Orientation Level: Appears intact for tasks assessed Behavior During Session:  (  Pt anxious to get surgery over with and go home)    Balance  Balance Balance Assessed: Yes Static Sitting Balance Static Sitting - Balance  Support: Feet supported Static Sitting - Level of Assistance: 5: Stand by assistance  End of Session PT - End of Session Equipment Utilized During Treatment: Gait belt Activity Tolerance: Patient limited by pain Patient left: in bed;with call bell/phone within reach;with nursing in room Nurse Communication: Mobility status   GP     Brady Adams 03/06/2012, 11:31 AM Jake Shark, PT DPT [phone]

## 2012-03-06 NOTE — Progress Notes (Signed)
Speech Language Pathology Dysphagia Treatment Patient Details Name: Brady Adams MRN: [id number] DOB: August 26, 1990 Today's Date: 03/06/2012 Time: 1530-1540 SLP Time Calculation (min): 10 min  Assessment / Plan / Recommendation Clinical Impression  F/u diet tolerance facilitated by SLP via po trials at bedside revealed no overt indication of aspiration during skilled observation. Patient independent with self feeding and use of safe swallowing strategies. No further acute SLP needs indicated for dysphagia at this time. Will f/u for cognitive goals.     Diet Recommendation  Continue with Current Diet: Regular;Thin liquid    SLP Plan All goals met   Pertinent Vitals/Pain n/a   Swallowing Goals  SLP Swallowing Goals Patient will consume recommended diet without observed clinical signs of aspiration with: Modified independent assistance Swallow Study Goal #1 - Progress: Met Patient will utilize recommended strategies during swallow to increase swallowing safety with: Modified independent assistance Swallow Study Goal #2 - Progress: Met  General Temperature Spikes Noted: No Respiratory Status: Room air Behavior/Cognition: Alert;Cooperative;Pleasant mood Oral Cavity - Dentition: Adequate natural dentition Patient Positioning: Upright in bed     Dysphagia Treatment Treatment focused on: Skilled observation of diet tolerance Treatment Methods/Modalities: Skilled observation Patient observed directly with PO's: Yes Type of PO's observed: Regular;Thin liquids Feeding: Able to feed self Liquids provided via: [employer] MA, CCC-SLP [phone]   Christoher Drudge [name] 03/06/2012, 4:16 PM

## 2012-03-06 NOTE — Progress Notes (Signed)
SBIRT completed- patient with a score of 21 and with a high risk for further etoh abuse. CSW will continue to follow and assist patient as he is willing to participate and acknowledge his problem with etoh. Patient up ambulating with PT shortly after our visit-  Reece Levy, MSW, [employer] [phone]
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
## 2012-03-06 NOTE — Progress Notes (Signed)
Occupational Therapy Treatment Patient Details Name: DAIMIEN PATMON MRN: [id number] DOB: [birth year] Today's Date: 03/06/2012 Time: 4401-0272 OT Time Calculation (min): 23 min  OT Assessment / Plan / Recommendation Comments on Treatment Session Pt progressing this session with OT however reports severe pain in LT UE. Pt reports decr pain with neutral shoulder position and elbow flexion adducted to body. Pt reports pain in neck with mobility. pt states "i am just ready to get out of here. If I wear this sling will it make me stay here longer? If yes I am fine". Pt very anxious to leave as soon as possible and wants to go home "lay on the couch". Pt states during session that he will report NO if it causes increased stay at hospital. So pt could provided inaccurate responses so it is important to make sure patient knows that response will not increased LOS.    Follow Up Recommendations  CIR    Barriers to Discharge       Equipment Recommendations  None recommended by OT    Recommendations for Other Services Rehab consult  Frequency Min 3X/week   Plan Discharge plan remains appropriate    Precautions / Restrictions Precautions Precautions: Fall;Cervical Precaution Comments: picc line Rt UE, foley, ccollar, fall Required Braces or Orthoses: Cervical Brace Cervical Brace: Hard collar;Applied in supine position Restrictions Weight Bearing Restrictions: No   Pertinent Vitals/Pain     ADL  Eating/Feeding: Modified independent Where Assessed - Eating/Feeding: Chair Toilet Transfer: Min Pension scheme manager Method: Sit to Barista: Raised toilet seat with arms (or 3-in-1 over toilet) Toileting - Clothing Manipulation and Hygiene: Minimal assistance Where Assessed - Engineer, mining and Hygiene: Sit to stand from 3-in-1 or toilet Equipment Used: Gait belt Transfers/Ambulation Related to ADLs: pt ambulating with unsteady gait and need for LT Ue to be  supported ADL Comments: pt supine on arrival s/ p bath with total (A) from tech. Pt agreeable to OOB on Rt side. pt using Rt UE to push into sitting. pt ambulating with gait deficits. pt with support for LT Ue to decr pain. OT requesting blue sling for Lt UE for comfort with mobility to manage pain pending sx 03/08/12 wednesday with Dr. Lovell Sheehan    OT Diagnosis:    OT Problem List:   OT Treatment Interventions:     OT Goals Acute Rehab OT Goals OT Goal Formulation: With patient Time For Goal Achievement: 03/18/12 Potential to Achieve Goals: Good ADL Goals Pt Will Perform Grooming: with set-up;Standing at sink;Unsupported ADL Goal: Grooming - Progress: Progressing toward goals Pt Will Perform Upper Body Bathing: with set-up;Standing at sink;Unsupported Pt Will Perform Lower Body Bathing: with set-up;Sit to stand from chair;Sitting at sink;Standing at sink Pt Will Perform Upper Body Dressing: with set-up;Sit to stand from chair;Sit to stand from bed Pt Will Perform Lower Body Dressing: with set-up;Sit to stand from chair Pt Will Transfer to Toilet: with set-up;Regular height toilet;Ambulation ADL Goal: Toilet Transfer - Progress: Progressing toward goals Miscellaneous OT Goals Miscellaneous OT Goal #1: Pt will complete ADLs maintaining cervical precautions at MOD I level OT Goal: Miscellaneous Goal #1 - Progress: Progressing toward goals  Visit Information  Last OT Received On: 03/06/12 Assistance Needed: +1 PT/OT Co-Evaluation/Treatment: Yes    Subjective Data      Prior Functioning       Cognition  Overall Cognitive Status: Appears within functional limits for tasks assessed/performed Area of Impairment: Rancho level Arousal/Alertness: Awake/alert Orientation Level: Appears intact for  tasks assessed Behavior During Session: Flat affect    Mobility  Shoulder Instructions Bed Mobility Bed Mobility: Supine to Sit;Sitting - Scoot to [place] of Bed;Sit to Supine Supine to Sit: 4:  Min guard Sitting - Scoot to [place] of Bed: 4: Min guard Details for Bed Mobility Assistance: Minguard for safety with lines and pt slightly impulsive with movement Transfers Sit to Stand: 4: Min guard;From bed Stand to Sit: 4: Min guard;To chair/3-in-1 Details for Transfer Assistance: Minguard for safety with cues for hand placement.  Pt slightly impuslive with movements.         Exercises      Balance Balance Balance Assessed: Yes Static Sitting Balance Static Sitting - Balance Support: Feet supported Static Sitting - Level of Assistance: 5: Stand by assistance   End of Session OT - End of Session Activity Tolerance: Patient tolerated treatment well Patient left: in chair;with call bell/phone within reach Nurse Communication: Mobility status;Precautions  GO     Lucile Shutters 03/06/2012, 2:00 PM Pager: [phone]

## 2012-03-07 ENCOUNTER — Encounter (HOSPITAL_COMMUNITY): Payer: Self-pay | Admitting: Physical Medicine and Rehabilitation

## 2012-03-07 ENCOUNTER — Encounter (HOSPITAL_COMMUNITY): Payer: Self-pay | Admitting: Anesthesiology

## 2012-03-07 DIAGNOSIS — S069XAA Unspecified intracranial injury with loss of consciousness status unknown, initial encounter: Secondary | ICD-10-CM

## 2012-03-07 DIAGNOSIS — S12300A Unspecified displaced fracture of fourth cervical vertebra, initial encounter for closed fracture: Secondary | ICD-10-CM

## 2012-03-07 DIAGNOSIS — S069X9A Unspecified intracranial injury with loss of consciousness of unspecified duration, initial encounter: Secondary | ICD-10-CM

## 2012-03-07 MED ORDER — ENSURE PUDDING PO PUDG: 1.0000 | Freq: Two times a day (BID) | ORAL | Status: DC

## 2012-03-07 MED ORDER — OXYCODONE HCL 5 MG PO TABS
10.0000 mg | ORAL_TABLET | ORAL | Status: DC | PRN
  Administered 2012-03-07 – 2012-03-09 (×5): 20 mg via ORAL
  Filled 2012-03-07 (×5): qty 4

## 2012-03-07 MED ORDER — ENSURE COMPLETE PO LIQD: 237.0000 mL | Freq: Two times a day (BID) | ORAL | Status: DC

## 2012-03-07 MED ORDER — MORPHINE SULFATE 2 MG/ML IJ SOLN
2.0000 mg | INTRAMUSCULAR | Status: DC | PRN
  Administered 2012-03-07 – 2012-03-08 (×4): 2 mg via INTRAVENOUS
  Filled 2012-03-07 (×6): qty 1
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
## 2012-03-07 NOTE — Progress Notes (Signed)
The patient's platelets are 127, but not to low to prohibit surgery.

## 2012-03-08 ENCOUNTER — Encounter (HOSPITAL_COMMUNITY): Payer: Self-pay | Admitting: Anesthesiology

## 2012-03-08 ENCOUNTER — Inpatient Hospital Stay (HOSPITAL_COMMUNITY): Payer: BC Managed Care – PPO | Admitting: Anesthesiology

## 2012-03-08 ENCOUNTER — Inpatient Hospital Stay (HOSPITAL_COMMUNITY): Payer: BC Managed Care – PPO

## 2012-03-08 ENCOUNTER — Encounter (HOSPITAL_COMMUNITY): Admission: EM | Disposition: A | Discharge status: Home / Self Care | Payer: Self-pay | Source: Home / Self Care

## 2012-03-08 HISTORY — PX: ANTERIOR CERVICAL CORPECTOMY: SHX1159

## 2012-03-08 LAB — MRSA PCR SCREENING: MRSA by PCR: NEGATIVE

## 2012-03-08 SURGERY — ANTERIOR CERVICAL CORPECTOMY
Anesthesia: General | Site: Neck | Wound class: Clean

## 2012-03-08 MED ORDER — LACTATED RINGERS IV SOLN
INTRAVENOUS | Status: DC | PRN
  Administered 2012-03-08 (×2): via INTRAVENOUS

## 2012-03-08 MED ORDER — DIAZEPAM 5 MG PO TABS: 5.0000 mg | ORAL_TABLET | Freq: Four times a day (QID) | ORAL | Status: DC | PRN

## 2012-03-08 MED ORDER — MIDAZOLAM HCL 5 MG/5ML IJ SOLN
INTRAMUSCULAR | Status: DC | PRN
  Administered 2012-03-08: 2 mg via INTRAVENOUS

## 2012-03-08 MED ORDER — 0.9 % SODIUM CHLORIDE (POUR BTL) OPTIME
TOPICAL | Status: DC | PRN
  Administered 2012-03-08: 1000 mL

## 2012-03-08 MED ORDER — LACTATED RINGERS IV SOLN
INTRAVENOUS | Status: DC
  Administered 2012-03-08: 20:00:00 via INTRAVENOUS

## 2012-03-08 MED ORDER — ONDANSETRON HCL 4 MG/2ML IJ SOLN
INTRAMUSCULAR | Status: DC | PRN
  Administered 2012-03-08: 4 mg via INTRAVENOUS

## 2012-03-08 MED ORDER — CEFAZOLIN SODIUM-DEXTROSE 2-3 GM-% IV SOLR
INTRAVENOUS | Status: AC
  Administered 2012-03-08: 2 g via INTRAVENOUS
  Filled 2012-03-08: qty 50

## 2012-03-08 MED ORDER — SODIUM CHLORIDE 0.9 % IR SOLN
Status: DC | PRN
  Administered 2012-03-08: 16:00:00

## 2012-03-08 MED ORDER — ACETAMINOPHEN 650 MG RE SUPP: 650.0000 mg | RECTAL | Status: DC | PRN

## 2012-03-08 MED ORDER — GLYCOPYRROLATE 0.2 MG/ML IJ SOLN
INTRAMUSCULAR | Status: DC | PRN
  Administered 2012-03-08: 0.6 mg via INTRAVENOUS

## 2012-03-08 MED ORDER — LIDOCAINE HCL (CARDIAC) 20 MG/ML IV SOLN
INTRAVENOUS | Status: DC | PRN
  Administered 2012-03-08: 70 mg via INTRAVENOUS

## 2012-03-08 MED ORDER — BUPIVACAINE-EPINEPHRINE 0.5% -1:200000 IJ SOLN
INTRAMUSCULAR | Status: DC | PRN
  Administered 2012-03-08: 10 mL

## 2012-03-08 MED ORDER — BACITRACIN 50000 UNITS IM SOLR
INTRAMUSCULAR | Status: AC
  Filled 2012-03-08: qty 1

## 2012-03-08 MED ORDER — MENTHOL 3 MG MT LOZG: 1.0000 | LOZENGE | OROMUCOSAL | Status: DC | PRN

## 2012-03-08 MED ORDER — PROPOFOL 10 MG/ML IV BOLUS
INTRAVENOUS | Status: DC | PRN
  Administered 2012-03-08: 200 mg via INTRAVENOUS

## 2012-03-08 MED ORDER — NEOSTIGMINE METHYLSULFATE 1 MG/ML IJ SOLN
INTRAMUSCULAR | Status: DC | PRN
  Administered 2012-03-08: 3 mg via INTRAVENOUS

## 2012-03-08 MED ORDER — DOCUSATE SODIUM 100 MG PO CAPS
100.0000 mg | ORAL_CAPSULE | Freq: Two times a day (BID) | ORAL | Status: DC
  Administered 2012-03-08: 100 mg via ORAL
  Filled 2012-03-08: qty 1

## 2012-03-08 MED ORDER — DEXAMETHASONE SODIUM PHOSPHATE 4 MG/ML IJ SOLN
4.0000 mg | Freq: Four times a day (QID) | INTRAMUSCULAR | Status: AC
  Filled 2012-03-08 (×3): qty 1

## 2012-03-08 MED ORDER — PHENOL 1.4 % MT LIQD: 1.0000 | OROMUCOSAL | Status: DC | PRN

## 2012-03-08 MED ORDER — ACETAMINOPHEN 325 MG PO TABS: 650.0000 mg | ORAL_TABLET | ORAL | Status: DC | PRN

## 2012-03-08 MED ORDER — MORPHINE SULFATE 2 MG/ML IJ SOLN
1.0000 mg | INTRAMUSCULAR | Status: DC | PRN
  Administered 2012-03-08: 2 mg via INTRAVENOUS

## 2012-03-08 MED ORDER — HYDROMORPHONE HCL PF 1 MG/ML IJ SOLN: 0.2500 mg | INTRAMUSCULAR | Status: DC | PRN

## 2012-03-08 MED ORDER — FENTANYL CITRATE 0.05 MG/ML IJ SOLN
INTRAMUSCULAR | Status: DC | PRN
  Administered 2012-03-08: 50 ug via INTRAVENOUS
  Administered 2012-03-08: 100 ug via INTRAVENOUS
  Administered 2012-03-08 (×2): 50 ug via INTRAVENOUS

## 2012-03-08 MED ORDER — OXYCODONE-ACETAMINOPHEN 5-325 MG PO TABS: 1.0000 | ORAL_TABLET | ORAL | Status: DC | PRN

## 2012-03-08 MED ORDER — THROMBIN 20000 UNITS EX KIT
PACK | CUTANEOUS | Status: DC | PRN
  Administered 2012-03-08: 20000 [IU] via TOPICAL

## 2012-03-08 MED ORDER — HYDROCODONE-ACETAMINOPHEN 5-325 MG PO TABS: 1.0000 | ORAL_TABLET | ORAL | Status: DC | PRN

## 2012-03-08 MED ORDER — ROCURONIUM BROMIDE 100 MG/10ML IV SOLN
INTRAVENOUS | Status: DC | PRN
  Administered 2012-03-08: 35 mg via INTRAVENOUS
  Administered 2012-03-08: 15 mg via INTRAVENOUS

## 2012-03-08 MED ORDER — BACITRACIN ZINC 500 UNIT/GM EX OINT
TOPICAL_OINTMENT | CUTANEOUS | Status: DC | PRN
  Administered 2012-03-08: 1 via TOPICAL

## 2012-03-08 MED ORDER — VECURONIUM BROMIDE 10 MG IV SOLR
INTRAVENOUS | Status: DC | PRN
  Administered 2012-03-08: 2 mg via INTRAVENOUS

## 2012-03-08 MED ORDER — CEFAZOLIN SODIUM-DEXTROSE 2-3 GM-% IV SOLR
2.0000 g | Freq: Three times a day (TID) | INTRAVENOUS | Status: AC
  Administered 2012-03-08 – 2012-03-09 (×2): 2 g via INTRAVENOUS
  Filled 2012-03-08 (×2): qty 50

## 2012-03-08 MED ORDER — ZOLPIDEM TARTRATE 5 MG PO TABS
5.0000 mg | ORAL_TABLET | Freq: Every evening | ORAL | Status: DC | PRN
  Administered 2012-03-08: 5 mg via ORAL
  Filled 2012-03-08: qty 1

## 2012-03-08 MED ORDER — HEMOSTATIC AGENTS (NO CHARGE) OPTIME
TOPICAL | Status: DC | PRN
  Administered 2012-03-08: 1 via TOPICAL

## 2012-03-08 MED ORDER — DEXAMETHASONE 4 MG PO TABS
4.0000 mg | ORAL_TABLET | Freq: Four times a day (QID) | ORAL | Status: AC
  Administered 2012-03-08 – 2012-03-09 (×3): 4 mg via ORAL
  Filled 2012-03-08 (×4): qty 1

## 2012-03-08 MED ORDER — ONDANSETRON HCL 4 MG/2ML IJ SOLN: 4.0000 mg | Freq: Once | INTRAMUSCULAR | Status: DC | PRN

## 2012-03-08 MED ORDER — SODIUM CHLORIDE 0.9 % IV SOLN
INTRAVENOUS | Status: AC
  Filled 2012-03-08: qty 500

## 2012-03-08 MED ORDER — ARTIFICIAL TEARS OP OINT
TOPICAL_OINTMENT | OPHTHALMIC | Status: DC | PRN
  Administered 2012-03-08: 1 via OPHTHALMIC

## 2012-03-08 MED ORDER — ONDANSETRON HCL 4 MG/2ML IJ SOLN: 4.0000 mg | INTRAMUSCULAR | Status: DC | PRN

## 2012-03-08 SURGICAL SUPPLY — 63 items
23 mm NIKO cage ×1 IMPLANT
APL SKNCLS STERI-STRIP NONHPOA (GAUZE/BANDAGES/DRESSINGS) ×1
BAG DECANTER FOR FLEXI CONT (MISCELLANEOUS) ×2 IMPLANT
BENZOIN TINCTURE PRP APPL 2/3 (GAUZE/BANDAGES/DRESSINGS) ×3 IMPLANT
BIT DRILL SM SPINE QC 14 (BIT) ×1 IMPLANT
BLADE SURG 15 STRL LF DISP TIS (BLADE) ×1 IMPLANT
BLADE SURG 15 STRL SS (BLADE) ×2
BLADE ULTRA TIP 2M (BLADE) ×2 IMPLANT
BRUSH SCRUB EZ PLAIN DRY (MISCELLANEOUS) ×2 IMPLANT
BUR BARREL STRAIGHT FLUTE 4.0 (BURR) ×2 IMPLANT
BUR MATCHSTICK NEURO 3.0 LAGG (BURR) ×2 IMPLANT
CANISTER SUCTION 2500CC (MISCELLANEOUS) ×2 IMPLANT
CLOTH BEACON ORANGE TIMEOUT ST (SAFETY) ×2 IMPLANT
CONT SPEC 4OZ CLIKSEAL STRL BL (MISCELLANEOUS) ×2 IMPLANT
COVER MAYO STAND STRL (DRAPES) ×2 IMPLANT
DRAPE LAPAROTOMY 100X72 PEDS (DRAPES) ×2 IMPLANT
DRAPE MICROSCOPE LEICA (MISCELLANEOUS) IMPLANT
DRAPE POUCH INSTRU U-SHP 10X18 (DRAPES) ×2 IMPLANT
DRAPE SURG 17X23 STRL (DRAPES) ×4 IMPLANT
ELECT BLADE 4.0 EZ CLEAN MEGAD (MISCELLANEOUS) ×2
ELECT REM PT RETURN 9FT ADLT (ELECTROSURGICAL) ×2
ELECTRODE BLDE 4.0 EZ CLN MEGD (MISCELLANEOUS) IMPLANT
ELECTRODE REM PT RTRN 9FT ADLT (ELECTROSURGICAL) ×1 IMPLANT
GAUZE SPONGE 4X4 16PLY XRAY LF (GAUZE/BANDAGES/DRESSINGS) ×1 IMPLANT
GLOVE BIO SURGEON STRL SZ 6.5 (GLOVE) ×2 IMPLANT
GLOVE BIO SURGEON STRL SZ8.5 (GLOVE) ×2 IMPLANT
GLOVE BIOGEL PI IND STRL 6.5 (GLOVE) IMPLANT
GLOVE BIOGEL PI INDICATOR 6.5 (GLOVE) ×1
GLOVE EXAM NITRILE LRG STRL (GLOVE) IMPLANT
GLOVE EXAM NITRILE MD LF STRL (GLOVE) IMPLANT
GLOVE EXAM NITRILE XL STR (GLOVE) IMPLANT
GLOVE EXAM NITRILE XS STR PU (GLOVE) IMPLANT
GLOVE SS BIOGEL STRL SZ 8 (GLOVE) ×1 IMPLANT
GLOVE SUPERSENSE BIOGEL SZ 8 (GLOVE) ×1
GOWN BRE IMP SLV AUR LG STRL (GOWN DISPOSABLE) ×1 IMPLANT
GOWN BRE IMP SLV AUR XL STRL (GOWN DISPOSABLE) ×1 IMPLANT
GOWN STRL REIN 2XL LVL4 (GOWN DISPOSABLE) ×1 IMPLANT
KIT BASIN OR (CUSTOM PROCEDURE TRAY) ×2 IMPLANT
KIT ROOM TURNOVER OR (KITS) ×2 IMPLANT
MARKER SKIN DUAL TIP RULER LAB (MISCELLANEOUS) ×2 IMPLANT
NDL SPNL 18GX3.5 QUINCKE PK (NEEDLE) ×1 IMPLANT
NEEDLE HYPO 22GX1.5 SAFETY (NEEDLE) ×2 IMPLANT
NEEDLE SPNL 18GX3.5 QUINCKE PK (NEEDLE) ×2 IMPLANT
NS IRRIG 1000ML POUR BTL (IV SOLUTION) ×2 IMPLANT
PACK LAMINECTOMY NEURO (CUSTOM PROCEDURE TRAY) ×2 IMPLANT
PATTIES SURGICAL .5 X.5 (GAUZE/BANDAGES/DRESSINGS) ×1 IMPLANT
PIN DISTRACTION 14MM (PIN) ×4 IMPLANT
PLATE ANT CERV XTEND 2 LV 30 (Plate) ×1 IMPLANT
PUTTY 5ML ACTIFUSE ABX (Putty) ×1 IMPLANT
RUBBERBAND STERILE (MISCELLANEOUS) IMPLANT
SCREW XTD VAR 4.2 SELF TAP (Screw) ×4 IMPLANT
SPONGE GAUZE 4X4 12PLY (GAUZE/BANDAGES/DRESSINGS) ×2 IMPLANT
SPONGE INTESTINAL PEANUT (DISPOSABLE) ×5 IMPLANT
SPONGE SURGIFOAM ABS GEL 100 (HEMOSTASIS) ×2 IMPLANT
STRIP CLOSURE SKIN 1/2X4 (GAUZE/BANDAGES/DRESSINGS) ×2 IMPLANT
SUT VIC AB 0 CT1 27 (SUTURE) ×2
SUT VIC AB 0 CT1 27XBRD ANTBC (SUTURE) ×1 IMPLANT
SUT VIC AB 3-0 SH 8-18 (SUTURE) ×2 IMPLANT
SYR 20ML ECCENTRIC (SYRINGE) ×2 IMPLANT
TAPE CLOTH SURG 4X10 WHT LF (GAUZE/BANDAGES/DRESSINGS) ×1 IMPLANT
TOWEL OR 17X24 6PK STRL BLUE (TOWEL DISPOSABLE) ×2 IMPLANT
TOWEL OR 17X26 10 PK STRL BLUE (TOWEL DISPOSABLE) ×2 IMPLANT
WATER STERILE IRR 1000ML POUR (IV SOLUTION) ×2 IMPLANT
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
## 2012-03-08 NOTE — Progress Notes (Signed)
Physical Therapy Treatment Patient Details Name: Brady Adams MRN: [id number] DOB: [birth year] Today's Date: 03/08/2012 Time: 7829-5621 PT Time Calculation (min): 8 min  PT Assessment / Plan / Recommendation Comments on Treatment Session  Limited session due to pain.  Pt with improved ambulation to supervision however decrease distance due to pain.  Plan for OR today will continue to assess next session for appropriate d/c plans.    Follow Up Recommendations  CIR     Equipment Recommendations  None recommended by PT    Recommendations for Other Services Rehab consult  Frequency Min 3X/week   Plan Discharge plan remains appropriate;Frequency remains appropriate    Precautions / Restrictions Precautions Precautions: Fall;Cervical Precaution Comments: Ccollar fall Required Braces or Orthoses: Cervical Brace Cervical Brace: Hard collar;Applied in supine position Restrictions Weight Bearing Restrictions: No   Pertinent Vitals/Pain 10/10 all over UE > LE left > right    Mobility  Bed Mobility Bed Mobility: Supine to Sit;Sitting - Scoot to Edge of Bed;Sit to Supine Supine to Sit: 6: Modified independent (Device/Increase time);HOB flat Sitting - Scoot to Edge of Bed: 6: Modified independent (Device/Increase time) Sit to Supine: 6: Modified independent (Device/Increase time);HOB flat Details for Bed Mobility Assistance: Pt completed bed mobility WFL baseline Transfers Transfers: Sit to Stand;Stand to Sit Sit to Stand: 5: Supervision;From bed Stand to Sit: 5: Supervision;To bed Details for Transfer Assistance: pt able to complete transfer wtih BIL LE no UE used.  Ambulation/Gait Ambulation/Gait Assistance: 5: Supervision Ambulation Distance (Feet): 30 Feet Assistive device: None Ambulation/Gait Assistance Details: Supervision for safety with slight impulsive movements Stairs: No Wheelchair Mobility Wheelchair Mobility: No    Exercises     PT Diagnosis:    PT Problem List:     PT Treatment Interventions:     PT Goals Acute Rehab PT Goals PT Goal Formulation: With patient Time For Goal Achievement: 03/18/12 Potential to Achieve Goals: Good Pt will go Supine/Side to Sit: with modified independence PT Goal: Supine/Side to Sit - Progress: Met Pt will go Sit to Supine/Side: with modified independence PT Goal: Sit to Supine/Side - Progress: Met Pt will go Sit to Stand: with supervision PT Goal: Sit to Stand - Progress: Met Pt will go Stand to Sit: with supervision PT Goal: Stand to Sit - Progress: Met Pt will Transfer Bed to Chair/Chair to Bed: with supervision PT Transfer Goal: Bed to Chair/Chair to Bed - Progress: Met Pt will Stand: with supervision;3 - 5 min PT Goal: Stand - Progress: Met Pt will Ambulate: 51 - 150 feet;with supervision;with least restrictive assistive device PT Goal: Ambulate - Progress: Progressing toward goal Pt will Go Up / Down Stairs: 3-5 stairs;with modified independence;with least restrictive assistive device PT Goal: Up/Down Stairs - Progress: Goal set today  Visit Information  Last PT Received On: 03/08/12 Assistance Needed: +1    Subjective Data  Subjective: "I hurt all over.  I can't do anymore." Patient Stated Goal: Wants to go home ASAP   Cognition  Overall Cognitive Status: Appears within functional limits for tasks assessed/performed Area of Impairment: [place] level Arousal/Alertness: Awake/alert Orientation Level: Appears intact for tasks assessed Behavior During Session: Flat affect    Balance  Balance Balance Assessed: Yes Static Sitting Balance Static Sitting - Balance Support: Feet supported Static Sitting - Level of Assistance: 5: Stand by assistance  End of Session PT - End of Session Equipment Utilized During Treatment: Gait belt Activity Tolerance: Patient limited by pain Patient left: in bed;with call bell/phone within reach;with  nursing in room Nurse Communication: Mobility status   GP      Colon Rueth 03/08/2012, 9:17 AM Jake Shark, PT DPT [phone]

## 2012-03-08 NOTE — Preoperative (Signed)
Beta Blockers   Reason not to administer Beta Blockers:Not Applicable 
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
## 2012-03-08 NOTE — Progress Notes (Signed)
Speech Language Pathology Treatment Patient Details Name: Brady Adams MRN: [id number] DOB: [birth year] Today's Date: 03/08/2012 Time: 4098-1191 SLP Time Calculation (min): 12 min  Assessment / Plan / Recommendation Clinical Impression  Treatment focused on family education. Discussed evaluation findings with mother, Barth Kirks, over the phone. Educated mother regarding deficits noted in the areas of attention, reasoning, and judgement. Mother states that these, in addition to impulsivity, are baseline for patient which SLP suspected during initial cognitive evaluation. Provided education regarding potential worsening of these impairements due to recent BI. Mother verbalized understanding and need to monitor for changes from baseline. No further acute skilled SLP needs indicated at this time. Please reconsult as needed.     SLP Plan  All goals met    Pertinent Vitals/Pain RN made aware of shoulder pain  SLP Goals  SLP Goals Progress/Goals/Alternative treatment plan discussed with pt/caregiver and they: Agree SLP Goal #1: Patient and caregivers will demonstrate anticipatory awareness of needs after discharge as they relate to potential cognitive changes related to acute TBI. SLP Goal #1 - Progress: Met  General Temperature Spikes Noted: No Respiratory Status: Room air Behavior/Cognition: Alert;Cooperative;Pleasant mood Oral Cavity - Dentition: Adequate natural dentition Patient Positioning: Upright in bed      Treatment Treatment focused on: Patient/family/caregiver education Family/Caregiver Educated: mother via phone Skilled Treatment: Treatment focused on family education. Discussed evaluation findings with mother, Barth Kirks, over the phone. Educated mother regarding deficits noted in the areas of attention, reasoning, and judgement. Mother states that these, in addition to impulsivity, are baseline for patient which SLP suspected during initial cognitive evaluation. Provided education regarding  potential worsening of these impairements due to recent BI. Mother verbalized understanding and need to monitor for changes from baseline. No further acute skilled SLP needs indicated at this time. Please reconsult as needed.    GO   [name] MA, CCC-SLP [phone]   [name] [name] 03/08/2012, 9:50 AM

## 2012-03-08 NOTE — Transfer of Care (Signed)
Immediate Anesthesia Transfer of Care Note  Patient: Brady Adams  Procedure(s) Performed: Procedure(s) (LRB) with comments: ANTERIOR CERVICAL CORPECTOMY (N/A) - Cervical Four Corpectomy/fusion/plating  Patient Location: PACU  Anesthesia Type:General  Level of Consciousness: awake, alert  and oriented  Airway & Oxygen Therapy: Patient Spontanous Breathing and Patient connected to nasal cannula oxygen  Post-op Assessment: Report given to PACU RN and Post -op Vital signs reviewed and stable  Post vital signs: Reviewed and stable  Complications: No apparent anesthesia complications

## 2012-03-08 NOTE — Anesthesia Procedure Notes (Signed)
Procedure Name: Intubation Date/Time: 03/08/2012 3:15 PM Performed by: Jerilee Hoh Pre-anesthesia Checklist: Patient identified, Emergency Drugs available, Suction available and Patient being monitored Patient Re-evaluated:Patient Re-evaluated prior to inductionOxygen Delivery Method: Circle system utilized Preoxygenation: Pre-oxygenation with 100% oxygen Intubation Type: IV induction Ventilation: Mask ventilation without difficulty Laryngoscope Size: Mac and 4 Grade View: Grade II Tube type: Oral Tube size: 7.5 mm Number of attempts: 1 Airway Equipment and Method: Stylet Placement Confirmation: ETT inserted through vocal cords under direct vision,  positive ETCO2 and breath sounds checked- equal and bilateral Secured at: 22 cm Tube secured with: Tape Dental Injury: Teeth and Oropharynx as per pre-operative assessment  Comments: Smooth IV induction.  Atraumatic oral intubation with C-spine stabilization by Dr. Katrinka Blazing.

## 2012-03-08 NOTE — Addendum Note (Signed)
Addendum  created 03/08/12 2335 by Jerilee Hoh, CRNA   Modules edited:Anesthesia Medication Administration

## 2012-03-08 NOTE — Progress Notes (Signed)
Still weak with upper extremities.    This patient has been seen and I agree with the findings and treatment plan.  Marta Lamas. Gae Bon, MD, FACS [phone] (pager) [phone] (direct pager) Trauma Surgeon
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
## 2012-03-08 NOTE — Addendum Note (Signed)
Addendum  created 03/08/12 2334 by Jerilee Hoh, CRNA   Modules edited:Anesthesia Medication Administration

## 2012-03-08 NOTE — Progress Notes (Signed)
Patient ID: Brady Adams, male   DOB: [birth year], 22 y.o.   MRN: [id number] Subjective:  The patient is alert and pleasant. He is in no apparent distress.  Objective: Vital signs in last 24 hours: Temp:  [98.7 F (37.1 C)-98.8 F (37.1 C)] 98.8 F (37.1 C) (01/22 1209) Pulse Rate:  [76-95] 76  (01/22 1209) Resp:  [18] 18  (01/22 1209) BP: (112-119)/(65-71) 118/70 mmHg (01/22 1209) SpO2:  [98 %-100 %] 98 % (01/22 1209)  Intake/Output from previous day: 01/21 0701 - 01/22 0700 In: -  Out: 350 [Urine:350] Intake/Output this shift: Total I/O In: 1000 [I.V.:1000] Out: -   Physical exam the patient is alert and pleasant. He is moving all 4 extremities well. His dressing is clean and dry. There is no evidence of hematoma or shift.  Lab Results: No results found for this basename: WBC:2,HGB:2,HCT:2,PLT:2 in the last 72 hours BMET  [facility] 03/06/12 0421  NA 137  K 3.6  CL 105  CO2 25  GLUCOSE 94  BUN 8  CREATININE 0.78  CALCIUM 8.8    Studies/Results: No results found.  Assessment/Plan: The patient is doing well.  LOS: 6 days     Bellamie Turney D 03/08/2012, 6:36 PM

## 2012-03-08 NOTE — Progress Notes (Signed)
Occupational Therapy Treatment Patient Details Name: Brady Adams MRN: [id number] DOB: 05-10-1990 Today's Date: 03/08/2012 Time: 6213-0865 OT Time Calculation (min): 8 min  OT Assessment / Plan / Recommendation Comments on Treatment Session Pt progressing well with therapy and depending on post surgery may not need CIR. Pending reeval s/p surg still at this time recommend CIR.    Follow Up Recommendations  CIR    Barriers to Discharge       Equipment Recommendations  None recommended by OT    Recommendations for Other Services Rehab consult  Frequency Min 3X/week   Plan Discharge plan remains appropriate    Precautions / Restrictions Precautions Precautions: Fall;Cervical Precaution Comments: Ccollar fall Required Braces or Orthoses: Cervical Brace Cervical Brace: Hard collar;Applied in supine position Restrictions Weight Bearing Restrictions: No   Pertinent Vitals/Pain Reports pain 10 out 10 all over Shoulder pain most severe Pt will only describe pain as "ALL Over" Pt asking if able to d/c home today after surgery    ADL  Grooming: Teeth care;Supervision/safety;Wash/dry face Where Assessed - Grooming: Unsupported standing Toilet Transfer: Radiographer, therapeutic Method: Sit to Barista: Regular height toilet Transfers/Ambulation Related to ADLs: Pt ambulating supervision from EOB to sink. Pt moving with incr speed this session ADL Comments: pt agreeable to OOB on arrival. pt completed bed mobility with long sitting and progressed to EOB mod I. Pt completed oral care at sink . Pt encouraged to complete bath and pt declined. Pt states I am in too much pain. Pt opening tooth paste and tooth bursh from plastic. No fine motor deficits noted with task. pt with no report of pain during task or facial grimance.    OT Diagnosis:    OT Problem List:   OT Treatment Interventions:     OT Goals Acute Rehab OT Goals OT Goal Formulation: With  patient Time For Goal Achievement: 03/18/12 Potential to Achieve Goals: Good ADL Goals Pt Will Perform Grooming: with set-up;Standing at sink;Unsupported ADL Goal: Grooming - Progress: Progressing toward goals Pt Will Perform Upper Body Bathing: with set-up;Standing at sink;Unsupported Pt Will Perform Lower Body Bathing: with set-up;Sit to stand from chair;Sitting at sink;Standing at sink Pt Will Perform Upper Body Dressing: with set-up;Sit to stand from chair;Sit to stand from bed Pt Will Perform Lower Body Dressing: with set-up;Sit to stand from chair Pt Will Transfer to Toilet: with set-up;Regular height toilet;Ambulation ADL Goal: Toilet Transfer - Progress: Progressing toward goals Miscellaneous OT Goals Miscellaneous OT Goal #1: Pt will complete ADLs maintaining cervical precautions at MOD I level OT Goal: Miscellaneous Goal #1 - Progress: Progressing toward goals  Visit Information  Last OT Received On: 03/08/12 Assistance Needed: +1 PT/OT Co-Evaluation/Treatment: Yes    Subjective Data      Prior Functioning       Cognition  Overall Cognitive Status: Appears within functional limits for tasks assessed/performed Area of Impairment: Rancho level Arousal/Alertness: Awake/alert Orientation Level: Appears intact for tasks assessed Behavior During Session: Flat affect    Mobility  Shoulder Instructions Bed Mobility Supine to Sit: 6: Modified independent (Device/Increase time);HOB flat Sitting - Scoot to Edge of Bed: 6: Modified independent (Device/Increase time) Sit to Supine: 6: Modified independent (Device/Increase time);HOB flat Details for Bed Mobility Assistance: Pt completed bed mobility WFL baseline Transfers Sit to Stand: 5: Supervision;From bed Stand to Sit: 5: Supervision;To bed Details for Transfer Assistance: pt able to complete transfer wtih BIL LE no UE used.        Exercises  Balance     End of Session OT - End of Session Activity Tolerance:  Patient tolerated treatment well Patient left: in bed;with call bell/phone within reach Nurse Communication: Mobility status;Precautions  GO     Harrel Carina [facility] 03/08/2012, 9:00 AM Pager: [phone]

## 2012-03-08 NOTE — Anesthesia Postprocedure Evaluation (Signed)
Anesthesia Post Note  Patient: Brady Adams  Procedure(s) Performed: Procedure(s) (LRB): ANTERIOR CERVICAL CORPECTOMY (N/A)  Anesthesia type: General  Patient location: PACU  Post pain: Pain level controlled and Adequate analgesia  Post assessment: Post-op Vital signs reviewed, Patient's Cardiovascular Status Stable, Respiratory Function Stable, Patent Airway and Pain level controlled  Last Vitals:  Filed Vitals:   03/08/12 1845  BP: 145/84  Pulse: 96  Temp:   Resp: 13    Post vital signs: Reviewed and stable  Level of consciousness: awake, alert  and oriented  Complications: No apparent anesthesia complications
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
## 2012-03-08 NOTE — Progress Notes (Signed)
UR completed 

## 2012-03-08 NOTE — Anesthesia Preprocedure Evaluation (Signed)
Anesthesia Evaluation  Patient identified by MRN, date of birth, ID band Patient awake    Reviewed: Allergy & Precautions, H&P , NPO status , Patient's Chart, lab work & pertinent test results  Airway Mallampati: I TM Distance: >3 FB Neck ROM: full    Dental   Pulmonary          Cardiovascular Rhythm:regular Rate:Normal     Neuro/Psych  C-spine cleared    GI/Hepatic   Endo/Other    Renal/GU      Musculoskeletal   Abdominal   Peds  Hematology   Anesthesia Other Findings   Reproductive/Obstetrics                           Anesthesia Physical Anesthesia Plan  ASA: II  Anesthesia Plan: General   Post-op Pain Management:    Induction: Intravenous  Airway Management Planned: Oral ETT  Additional Equipment:   Intra-op Plan:   Post-operative Plan: Extubation in OR  Informed Consent: I have reviewed the patients History and Physical, chart, labs and discussed the procedure including the risks, benefits and alternatives for the proposed anesthesia with the patient or authorized representative who has indicated his/her understanding and acceptance.     Plan Discussed with: CRNA, Anesthesiologist and Surgeon  Anesthesia Plan Comments:         Anesthesia Quick Evaluation

## 2012-03-09 LAB — BASIC METABOLIC PANEL
CO2: 27 mEq/L (ref 19–32)
Calcium: 9.4 mg/dL (ref 8.4–10.5)
Potassium: 3.8 mEq/L (ref 3.5–5.1)
Sodium: 138 mEq/L (ref 135–145)

## 2012-03-09 LAB — CBC
Hemoglobin: 14 g/dL (ref 13.0–17.0)
MCH: 31.5 pg (ref 26.0–34.0)
Platelets: 209 10*3/uL (ref 150–400)
RBC: 4.45 MIL/uL (ref 4.22–5.81)
WBC: 8 10*3/uL (ref 4.0–10.5)

## 2012-03-09 MED ORDER — OXYCODONE-ACETAMINOPHEN 10-325 MG PO TABS: 1.0000 | ORAL_TABLET | ORAL | Status: DC | PRN

## 2012-03-09 MED ORDER — DIAZEPAM 5 MG PO TABS: 5.0000 mg | ORAL_TABLET | Freq: Four times a day (QID) | ORAL | Status: DC | PRN

## 2012-03-09 NOTE — Progress Notes (Signed)
Orthopedic Tech Progress Note Patient Details:  Brady Adams 1991-01-17 [id number]  Patient ID: Brady Adams, male   DOB: 1990-12-03, 22 y.o.   MRN: [id number] Brace order completed by Storm Frisk, Aftan Vint 03/09/2012, 8:09 PM

## 2012-03-09 NOTE — Progress Notes (Signed)
I am following pt today for Brady Adams, Admission Coordinator, postoperatively to assess his functional status postoperatively with therapy. Preoperatively we felt pt most appropriate for outpatient therapy. [phone]
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
## 2012-03-09 NOTE — Progress Notes (Signed)
Physical Therapy Treatment Patient Details Name: MAYS PAINO MRN: [id number] DOB: 1990-12-15 Today's Date: 03/09/2012 Time: 4098-1191 PT Time Calculation (min): 18 min  PT Assessment / Plan / Recommendation Comments on Treatment Session  Pt pleasant this am and excited about going home.  Pt with increase ambulation distance and able to tolerate increase mobility.  Pt will benefit from OPPT for overall strength and further balance assessment.   Pt has met most goals and will have 24 hour supervision at home.     Follow Up Recommendations  Outpatient PT     Equipment Recommendations  None recommended by PT    Frequency Min 3X/week   Plan Frequency remains appropriate;Discharge plan needs to be updated    Precautions / Restrictions Precautions Precautions: Fall;Cervical Precaution Comments: Ccollar fall Required Braces or Orthoses: Cervical Brace Cervical Brace: Hard collar;Applied in supine position (new brace delivered by [employer]) Restrictions Weight Bearing Restrictions: No   Pertinent Vitals/Pain C/o numbness in bilateral UE left > right, however never rate pain    Mobility  Bed Mobility Bed Mobility: Not assessed Transfers Transfers: Sit to Stand;Stand to Sit Sit to Stand: 5: Supervision;From bed Stand to Sit: 5: Supervision;To bed Details for Transfer Assistance: pt able to complete transfer wtih BIL LE no UE used.  Ambulation/Gait Ambulation/Gait Assistance: 5: Supervision Ambulation Distance (Feet): 300 Feet Assistive device: None Ambulation/Gait Assistance Details: Supervision for safety Stairs: No Wheelchair Mobility Wheelchair Mobility: No     PT Diagnosis:    PT Problem List:   PT Treatment Interventions:     PT Goals Acute Rehab PT Goals PT Goal Formulation: With patient Time For Goal Achievement: 03/18/12 Potential to Achieve Goals: Good Pt will go Supine/Side to Sit: with modified independence PT Goal: Supine/Side to Sit - Progress: Progressing  toward goal Pt will go Sit to Supine/Side: with modified independence PT Goal: Sit to Supine/Side - Progress: Progressing toward goal Pt will go Sit to Stand: with supervision PT Goal: Sit to Stand - Progress: Met Pt will go Stand to Sit: with supervision PT Goal: Stand to Sit - Progress: Met Pt will Transfer Bed to Chair/Chair to Bed: with supervision PT Transfer Goal: Bed to Chair/Chair to Bed - Progress: Met Pt will Stand: with supervision;3 - 5 min PT Goal: Stand - Progress: Met Pt will Ambulate: 51 - 150 feet;with supervision;with least restrictive assistive device PT Goal: Ambulate - Progress: Met  Visit Information  Last PT Received On: 03/09/12 Assistance Needed: +1    Subjective Data  Subjective: "I'm much better today about 80% better." Patient Stated Goal: I'm ready to go home.   Cognition  Overall Cognitive Status: Appears within functional limits for tasks assessed/performed Area of Impairment: Rancho level Arousal/Alertness: Awake/alert Orientation Level: Appears intact for tasks assessed Behavior During Session: Flat affect    Balance  Balance Balance Assessed: Yes Static Sitting Balance Static Sitting - Balance Support: Feet supported Static Sitting - Level of Assistance: 5: Stand by assistance Static Standing Balance Static Standing - Balance Support: Bilateral upper extremity supported Static Standing - Level of Assistance: 5: Stand by assistance Static Standing - Comment/# of Minutes: ~5 minutes to preform ADLs  End of Session PT - End of Session Equipment Utilized During Treatment: Gait belt Activity Tolerance: Patient tolerated treatment well Patient left: in chair;with call bell/phone within reach Nurse Communication: Mobility status   GP     Abigal Choung 03/09/2012, 9:18 AM Jake Shark, PT DPT [phone]
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
## 2012-03-10 ENCOUNTER — Encounter (HOSPITAL_COMMUNITY): Payer: Self-pay | Admitting: Neurosurgery

## 2013-05-15 IMAGING — CR DG CERVICAL SPINE 2 OR 3 VIEWS
1 series · 1 of 1 positions shown · non-contrast
Comparison: 03/06/2012 MR.

CLINICAL DATA: C4 corpectomy and fusion.

CERVICAL SPINE - 2-3 VIEW

[view not recorded]
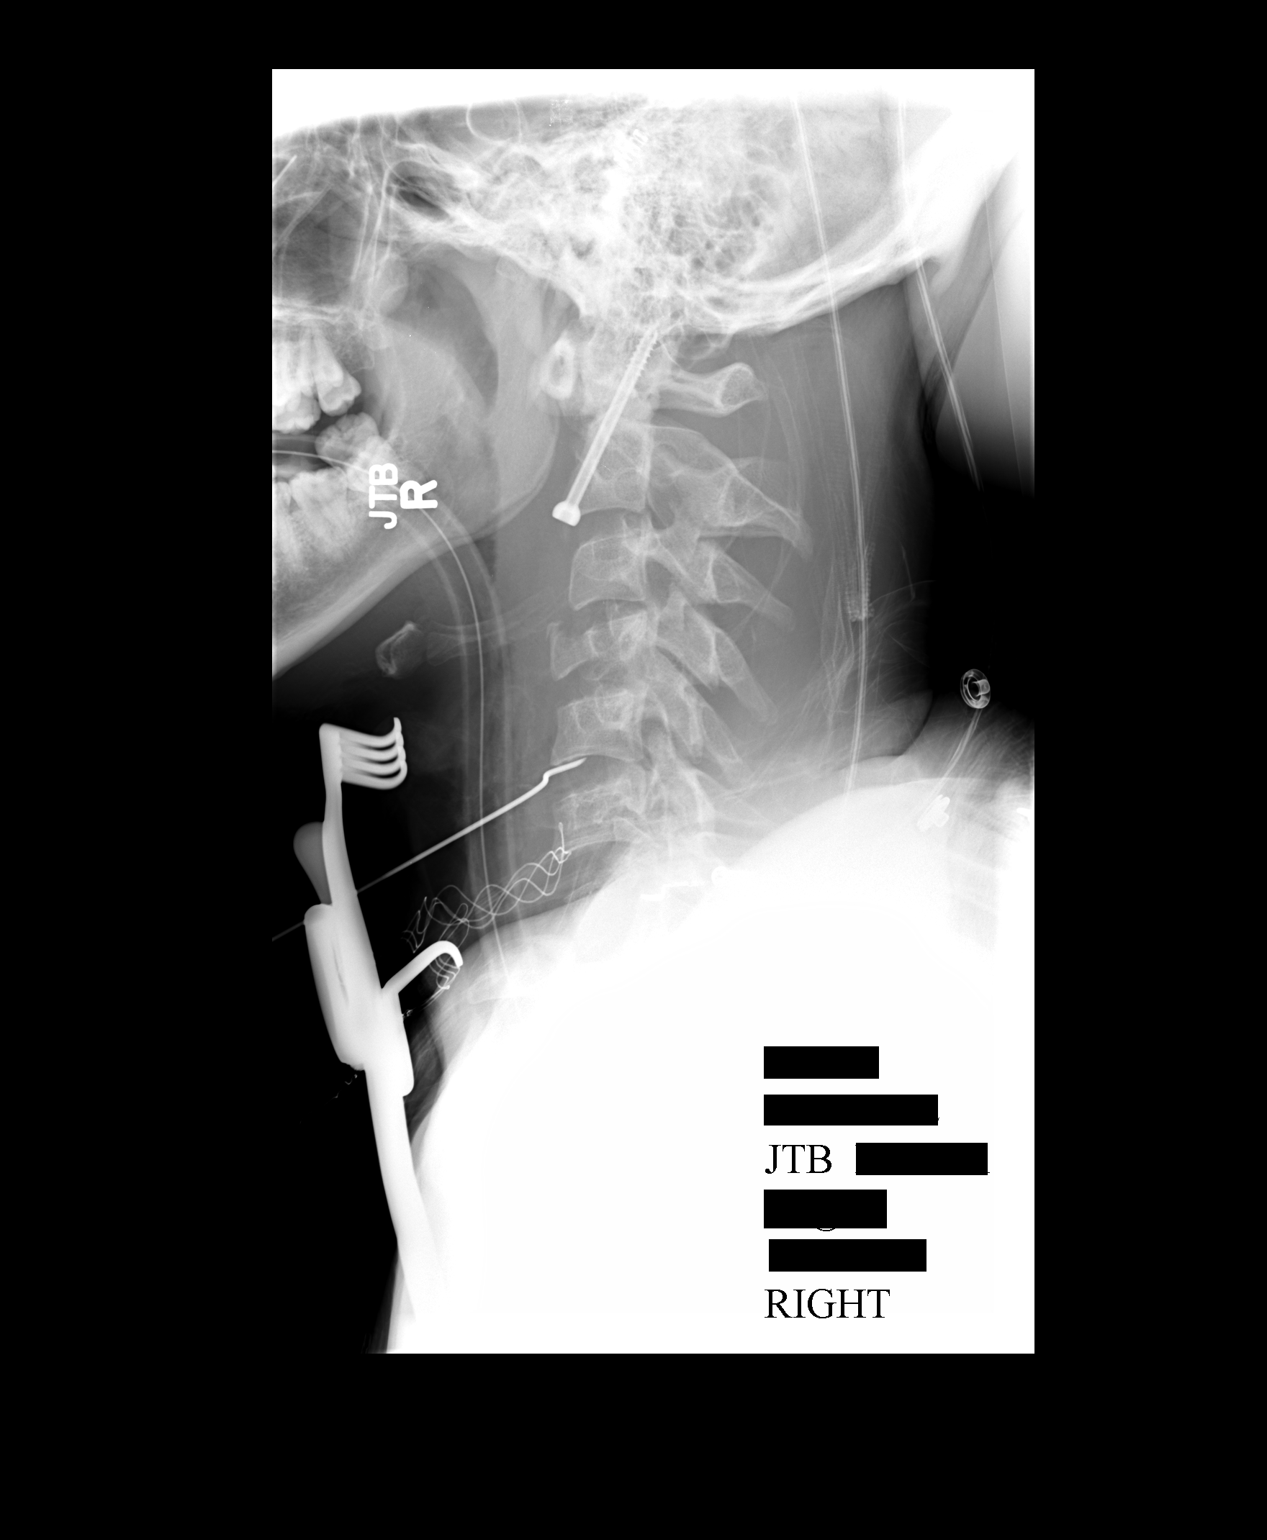

[1 of 1 positions shown; findings below may reference images not displayed]

FINDINGS: Three intraoperative lateral views of the cervical spine
submitted for review after surgery.

Prior dens fracture treated with screw.  There is an anterior tilt
of the dens fracture which remains well defined as a lucency.  The
screw projects posterior-superior to the and hands.  Please see MR
report.

Fracture of the C4 vertebra with anterior displacement of the
fracture fragment and mild retrolisthesis of posterior aspect of
the vertebra.

First film reveals metallic probez at the C5-6 level with surgical
drain in place.

Second film reveals metallic rakes anterior to the C4 level with
resection of fracture fragment.

Third film reveals anterior plate and screws spanning between the
C3 and C5 vertebral body with metallic markers from interbody
spacer at the corpectomy site spanning from the lower aspect of C3
to the superior aspect of C5.

The posterior inferior aspect of C3 in the posterior-superior
aspect of C5 projects slightly posteriorly which may be related to
spur formation.

Surgical sponges remain in place.
IMPRESSION: C4 corpectomy with fusion C3- C5.

Remote C2 fracture as detailed above.

## 2015-08-11 ENCOUNTER — Emergency Department (HOSPITAL_COMMUNITY)
Admission: EM | Admit: 2015-08-11 | Discharge: 2015-08-11 | Disposition: A | Discharge status: Home / Self Care | Payer: No Typology Code available for payment source | Attending: Emergency Medicine | Admitting: Emergency Medicine

## 2015-08-11 ENCOUNTER — Encounter (HOSPITAL_COMMUNITY): Payer: Self-pay | Admitting: Emergency Medicine

## 2015-08-11 DIAGNOSIS — F17210 Nicotine dependence, cigarettes, uncomplicated: Secondary | ICD-10-CM | POA: Insufficient documentation

## 2015-08-11 DIAGNOSIS — Z79899 Other long term (current) drug therapy: Secondary | ICD-10-CM | POA: Insufficient documentation

## 2015-08-11 DIAGNOSIS — K047 Periapical abscess without sinus: Secondary | ICD-10-CM | POA: Insufficient documentation

## 2015-08-11 DIAGNOSIS — K0889 Other specified disorders of teeth and supporting structures: Secondary | ICD-10-CM

## 2015-08-11 MED ORDER — CLINDAMYCIN HCL 150 MG PO CAPS: 300.0000 mg | ORAL_CAPSULE | Freq: Three times a day (TID) | ORAL | Status: AC

## 2015-08-11 MED ORDER — CLINDAMYCIN HCL 150 MG PO CAPS
300.0000 mg | ORAL_CAPSULE | Freq: Once | ORAL | Status: AC
  Administered 2015-08-11: 300 mg via ORAL
  Filled 2015-08-11: qty 2

## 2015-08-11 NOTE — Discharge Instructions (Signed)
Dental Abscess °A dental abscess is a collection of pus in or around a tooth. °CAUSES °This condition is caused by a bacterial infection around the root of the tooth that involves the inner part of the tooth (pulp). It may result from: °· Severe tooth decay. °· Trauma to the tooth that allows bacteria to enter into the pulp, such as a broken or chipped tooth. °· Severe gum disease around a tooth. °SYMPTOMS °Symptoms of this condition include: °· Severe pain in and around the infected tooth. °· Swelling and redness around the infected tooth, in the mouth, or in the face. °· Tenderness. °· Pus drainage. °· Bad breath. °· Bitter taste in the mouth. °· Difficulty swallowing. °· Difficulty opening the mouth. °· Nausea. °· Vomiting. °· Chills. °· Swollen neck glands. °· Fever. °DIAGNOSIS °This condition is diagnosed with examination of the infected tooth. During the exam, your dentist may tap on the infected tooth. Your dentist will also ask about your medical and dental history and may order X-rays. °TREATMENT °This condition is treated by eliminating the infection. This may be done with: °· Antibiotic medicine. °· A root canal. This may be performed to save the tooth. °· Pulling (extracting) the tooth. This may also involve draining the abscess. This is done if the tooth cannot be saved. °HOME CARE INSTRUCTIONS °· Take medicines only as directed by your dentist. °· If you were prescribed antibiotic medicine, finish all of it even if you start to feel better. °· Rinse your mouth (gargle) often with salt water to relieve pain or swelling. °· Do not drive or operate heavy machinery while taking pain medicine. °· Do not apply heat to the outside of your mouth. °· Keep all follow-up visits as directed by your dentist. This is important. °SEEK MEDICAL CARE IF: °· Your pain is worse and is not helped by medicine. °SEEK IMMEDIATE MEDICAL CARE IF: °· You have a fever or chills. °· Your symptoms suddenly get worse. °· You have a  very bad headache. °· You have problems breathing or swallowing. °· You have trouble opening your mouth. °· You have swelling in your neck or around your eye. °  °This information is not intended to replace advice given to you by your health care provider. Make sure you discuss any questions you have with your health care provider. °  °Document Released: 02/01/2005 Document Revised: 06/18/2014 Document Reviewed: 01/29/2014 °Elsevier Interactive Patient Education ©2016 [employer] ° °Dental Care and Dentist Visits °Dental care supports good overall health. Regular dental visits can also help you avoid dental pain, bleeding, infection, and other more serious health problems in the future. It is important to keep the mouth healthy because diseases in the teeth, gums, and other oral tissues can spread to other areas of the body. Some problems, such as diabetes, heart disease, and pre-term labor have been associated with poor oral health.  °See your dentist every 6 months. If you experience emergency problems such as a toothache or broken tooth, go to the dentist right away. If you see your dentist regularly, you may catch problems early. It is easier to be treated for problems in the early stages.  °WHAT TO EXPECT AT A DENTIST VISIT  °Your dentist will look for many common oral health problems and recommend proper treatment. At your regular dental visit, you can expect: °· Gentle cleaning of the teeth and gums. This includes scraping and polishing. This helps to remove the sticky substance around the teeth and gums (  plaque). Plaque forms in the mouth shortly after eating. Over time, plaque hardens on the teeth as tartar. If tartar is not removed regularly, it can cause problems. Cleaning also helps remove stains. °· Periodic X-rays. These pictures of the teeth and supporting bone will help your dentist assess the health of your teeth. °· Periodic fluoride treatments. Fluoride is a natural mineral shown to help  strengthen teeth. Fluoride treatment involves applying a fluoride gel or varnish to the teeth. It is most commonly done in children. °· Examination of the mouth, tongue, jaws, teeth, and gums to look for any oral health problems, such as: °¨ Cavities (dental caries). This is decay on the tooth caused by plaque, sugar, and acid in the mouth. It is best to catch a cavity when it is small. °¨ Inflammation of the gums caused by plaque buildup (gingivitis). °¨ Problems with the mouth or malformed or misaligned teeth. °¨ Oral cancer or other diseases of the soft tissues or jaws.  °KEEP YOUR TEETH AND GUMS HEALTHY °For healthy teeth and gums, follow these general guidelines as well as your dentist's specific advice: °· Have your teeth professionally cleaned at the dentist every 6 months. °· Brush twice daily with a fluoride toothpaste. °· Floss your teeth daily.  °· Ask your dentist if you need fluoride supplements, treatments, or fluoride toothpaste. °· Eat a healthy diet. Reduce foods and drinks with added sugar. °· Avoid smoking. °TREATMENT FOR ORAL HEALTH PROBLEMS °If you have oral health problems, treatment varies depending on the conditions present in your teeth and gums. °· Your caregiver will most likely recommend good oral hygiene at each visit. °· For cavities, gingivitis, or other oral health disease, your caregiver will perform a procedure to treat the problem. This is typically done at a separate appointment. Sometimes your caregiver will refer you to another dental specialist for specific tooth problems or for surgery. °SEEK IMMEDIATE DENTAL CARE IF: °· You have pain, bleeding, or soreness in the gum, tooth, jaw, or mouth area. °· A permanent tooth becomes loose or separated from the gum socket. °· You experience a blow or injury to the mouth or jaw area. °  °This information is not intended to replace advice given to you by your health care provider. Make sure you discuss any questions you have with your  health care provider. °  °Document Released: 10/14/2010 Document Revised: 04/26/2011 Document Reviewed: 10/14/2010 °Elsevier Interactive Patient Education ©2016 [employer] ° °

## 2015-08-11 NOTE — ED Notes (Signed)
Patient complaining of dental abscess x 4 days. States he was [facility] days ago and given antibiotics. States he is still in pain.
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
